# Patient Record
Sex: Male | Born: 1945 | Race: White | Hispanic: No | Marital: Married | Smoking: Former smoker
Health system: Southern US, Community
[De-identification: ages and names within clinical notes are randomized; demographics above are authoritative.]

## PROBLEM LIST (undated history)

## (undated) DIAGNOSIS — M1990 Unspecified osteoarthritis, unspecified site: Secondary | ICD-10-CM

## (undated) DIAGNOSIS — K219 Gastro-esophageal reflux disease without esophagitis: Secondary | ICD-10-CM

## (undated) DIAGNOSIS — E785 Hyperlipidemia, unspecified: Secondary | ICD-10-CM

## (undated) DIAGNOSIS — G4733 Obstructive sleep apnea (adult) (pediatric): Principal | ICD-10-CM

## (undated) DIAGNOSIS — I10 Essential (primary) hypertension: Secondary | ICD-10-CM

## (undated) DIAGNOSIS — Z87442 Personal history of urinary calculi: Secondary | ICD-10-CM

## (undated) HISTORY — PX: LITHOTRIPSY: SUR834

## (undated) HISTORY — DX: Obstructive sleep apnea (adult) (pediatric): G47.33

## (undated) HISTORY — DX: Essential (primary) hypertension: I10

## (undated) HISTORY — DX: Hyperlipidemia, unspecified: E78.5

## (undated) HISTORY — PX: WRIST FRACTURE SURGERY: SHX121

## (undated) HISTORY — PX: COLONOSCOPY: SHX174

## (undated) HISTORY — PX: PATELLA FRACTURE SURGERY: SHX735

---

## 2000-02-01 ENCOUNTER — Other Ambulatory Visit: Admission: RE | Admit: 2000-02-01 | Discharge: 2000-02-01 | Payer: Self-pay | Admitting: *Deleted

## 2001-01-30 ENCOUNTER — Encounter: Admission: RE | Admit: 2001-01-30 | Discharge: 2001-01-30 | Payer: Self-pay | Admitting: Urology

## 2001-01-30 ENCOUNTER — Encounter: Payer: Self-pay | Admitting: Urology

## 2001-08-12 ENCOUNTER — Emergency Department (HOSPITAL_COMMUNITY): Admission: EM | Admit: 2001-08-12 | Discharge: 2001-08-12 | Payer: Self-pay | Admitting: *Deleted

## 2001-08-12 ENCOUNTER — Encounter: Payer: Self-pay | Admitting: *Deleted

## 2009-06-02 ENCOUNTER — Encounter: Admission: RE | Admit: 2009-06-02 | Discharge: 2009-06-02 | Payer: Self-pay | Admitting: Endocrinology

## 2010-07-13 ENCOUNTER — Encounter: Admission: RE | Admit: 2010-07-13 | Discharge: 2010-07-13 | Payer: Self-pay | Admitting: Endocrinology

## 2014-06-09 ENCOUNTER — Ambulatory Visit (INDEPENDENT_AMBULATORY_CARE_PROVIDER_SITE_OTHER): Payer: Medicare Other | Admitting: Pulmonary Disease

## 2014-06-09 ENCOUNTER — Encounter (INDEPENDENT_AMBULATORY_CARE_PROVIDER_SITE_OTHER): Payer: Self-pay

## 2014-06-09 ENCOUNTER — Encounter: Payer: Self-pay | Admitting: Pulmonary Disease

## 2014-06-09 VITALS — BP 100/62 | HR 94 | Ht 67.75 in | Wt 191.8 lb

## 2014-06-09 DIAGNOSIS — G4733 Obstructive sleep apnea (adult) (pediatric): Secondary | ICD-10-CM

## 2014-06-09 DIAGNOSIS — R0609 Other forms of dyspnea: Secondary | ICD-10-CM

## 2014-06-09 DIAGNOSIS — R0683 Snoring: Secondary | ICD-10-CM

## 2014-06-09 DIAGNOSIS — R0989 Other specified symptoms and signs involving the circulatory and respiratory systems: Secondary | ICD-10-CM

## 2014-06-09 HISTORY — DX: Obstructive sleep apnea (adult) (pediatric): G47.33

## 2014-06-09 NOTE — Progress Notes (Deleted)
   Subjective:    Patient ID: Bradley Rose, male    DOB: 06-15-1946, 68 y.o.   MRN: [id number]  HPI    Review of Systems  Constitutional: Negative for fever and unexpected weight change.  HENT: Negative for congestion, dental problem, ear pain, nosebleeds, postnasal drip, rhinorrhea, sinus pressure, sneezing, sore throat and trouble swallowing.   Eyes: Negative for redness and itching.  Respiratory: Negative for cough, chest tightness, shortness of breath and wheezing.   Cardiovascular: Negative for palpitations and leg swelling.  Gastrointestinal: Negative for nausea and vomiting.       Indigestion  Genitourinary: Negative for dysuria.  Musculoskeletal: Positive for arthralgias. Negative for joint swelling.  Skin: Negative for rash.  Neurological: Negative for headaches.  Hematological: Does not bruise/bleed easily.  Psychiatric/Behavioral: Negative for dysphoric mood. The patient is not nervous/anxious.        Objective:   Physical Exam        Assessment & Plan:

## 2014-06-09 NOTE — Assessment & Plan Note (Signed)
He has snoring, sleep disruption, witnessed apnea, and daytime sleepiness.  He has history of hypertension and family history of sleep apnea.  I am concerned he could also have sleep apnea.  We discussed how sleep apnea can affect various health problems including risks for hypertension, cardiovascular disease, and diabetes.  We also discussed how sleep disruption can increase risks for accident, such as while driving.  Weight loss as a means of improving sleep apnea was also reviewed.  Additional treatment options discussed were CPAP therapy, oral appliance, and surgical intervention.  To further assess will arrange for home sleep study pending insurance approval.

## 2014-06-09 NOTE — Progress Notes (Signed)
Chief Complaint  Patient presents with  . SLEEP CONSULT    Referred by Dr Doristine Counter. Epworth Score: 4    History of Present Illness: Bradley Rose is a 68 y.o. male for evaluation of sleep problems.  His wife has been concerned about his snoring.  She has also told him that he stops breathing while asleep.  He was advised several years ago about needing a sleep study, but didn't follow through.  His brother was found to have heart problems and sleep apnea.  As a result he was more concerned about his sleep issues.  He goes to sleep at 10 pm.  He falls asleep quickly.  He wakes up 1 or 2 times to use the bathroom.  He gets out of bed at 6 am.  He feels okay in the morning.  He denies morning headache.  He does not use anything to help him fall sleep.  He drinks coffee in the morning.  He has trouble sleeping on his back.  He will have to shift positions while asleep due to shoulder pain.  He will sometimes take a hydrocodone to help with the pain, but not often.  He denies sleep walking, sleep talking, bruxism, or nightmares.  There is no history of restless legs.  He denies sleep hallucinations, sleep paralysis, or cataplexy.  The Epworth score is 4 out of 24.  Bradley Rose  has a past medical history of HTN (hypertension) and Hyperlipidemia.  Bradley Rose  has past surgical history that includes Lithotripsy.     Medication List       This list is accurate as of: 06/09/14  2:37 PM.  Always use your most recent med list.               allopurinol 300 MG tablet  Commonly known as:  ZYLOPRIM  Take 300 mg by mouth daily.     aspirin 81 MG tablet  Take 81 mg by mouth daily.     atorvastatin 80 MG tablet  Commonly known as:  LIPITOR  Take 80 mg by mouth daily.     celecoxib 200 MG capsule  Commonly known as:  CELEBREX  Take 200 mg by mouth daily.     ezetimibe 10 MG tablet  Commonly known as:  ZETIA  Take 10 mg by mouth daily.     Fish Oil 1000 MG Caps  Take 1  capsule by mouth daily.     HYDROcodone-acetaminophen 5-325 MG per tablet  Commonly known as:  NORCO/VICODIN  Take 1 tablet by mouth every 6 (six) hours as needed for moderate pain.     omeprazole 20 MG capsule  Commonly known as:  PRILOSEC  Take 20 mg by mouth daily.     valsartan-hydrochlorothiazide 160-25 MG per tablet  Commonly known as:  DIOVAN-HCT  Take 1 tablet by mouth daily.     Vitamin D3 50000 UNITS Caps  Take 1 capsule by mouth every 21 ( twenty-one) days.     ZANTAC PO  Take by mouth as needed.        No Known Allergies  His family history includes Hypertension in his father; Macular degeneration in his mother; Sleep apnea in his brother.  He  reports that he quit smoking about 20 years ago. His smoking use included Cigarettes. He has a 12.5 pack-year smoking history. He does not have any smokeless tobacco history on file. He reports that he drinks alcohol. He reports that he does not  use illicit drugs.  Review of Systems  Constitutional: Negative for fever and unexpected weight change.  HENT: Negative for congestion, dental problem, ear pain, nosebleeds, postnasal drip, rhinorrhea, sinus pressure, sneezing, sore throat and trouble swallowing.   Eyes: Negative for redness and itching.  Respiratory: Negative for cough, chest tightness, shortness of breath and wheezing.   Cardiovascular: Negative for palpitations and leg swelling.  Gastrointestinal: Negative for nausea and vomiting.       Indigestion  Genitourinary: Negative for dysuria.  Musculoskeletal: Positive for arthralgias. Negative for joint swelling.  Skin: Negative for rash.  Neurological: Negative for headaches.  Hematological: Does not bruise/bleed easily.  Psychiatric/Behavioral: Negative for dysphoric mood. The patient is not nervous/anxious.    Physical Exam:  General - No distress ENT - No sinus tenderness, no oral exudate, no LAN, no thyromegaly, TM clear, pupils equal/reactive, MP 2, high  arched palate, nasal septal deviation Cardiac - s1s2 regular, no murmur, pulses symmetric Chest - No wheeze/rales/dullness, good air entry, normal respiratory excursion Back - No focal tenderness Abd - Soft, non-tender, no organomegaly, + bowel sounds Ext - No edema Neuro - Normal strength, cranial nerves intact Skin - No rashes Psych - Normal mood, and behavior  Assessment/plan:  Coralyn Helling, M.D. Pager [phone]

## 2014-06-09 NOTE — Patient Instructions (Signed)
Will arrange for home sleep study Will call to arrange for follow up after sleep study reviewed  

## 2014-07-04 ENCOUNTER — Institutional Professional Consult (permissible substitution): Payer: Self-pay | Admitting: Pulmonary Disease

## 2014-08-02 ENCOUNTER — Telehealth: Payer: Self-pay | Admitting: Pulmonary Disease

## 2014-08-02 NOTE — Telephone Encounter (Signed)
Called spoke with patient who reported [facility] and was able to schedule another time to pick up an [place]. Per pt, nothing further is needed Will sign off

## 2014-08-02 NOTE — Telephone Encounter (Signed)
Called & spoke with patient.  I have [facility]cheduled him to [facility]

## 2014-08-08 DIAGNOSIS — G4730 Sleep apnea, unspecified: Secondary | ICD-10-CM

## 2014-08-10 ENCOUNTER — Telehealth: Payer: Self-pay | Admitting: Pulmonary Disease

## 2014-08-10 ENCOUNTER — Encounter: Payer: Self-pay | Admitting: Pulmonary Disease

## 2014-08-10 DIAGNOSIS — G4733 Obstructive sleep apnea (adult) (pediatric): Secondary | ICD-10-CM

## 2014-08-10 NOTE — Telephone Encounter (Signed)
HST 08/08/14 >> AHI 49.9, SaO2 low 81%.  Will have my nurse inform pt that sleep study shows severe sleep apnea.  Options at this time are 1) arrange for CPAP set up now with ROV in 2 months, 2) ROV first.  If pt is okay with CPAP set up, then please send order for auto CPAP range 5 to 15 cm H2O with heated humidity and mask of choice.  Have download sent 1 month after starting CPAP and ROV 2 months after starting CPAP.

## 2014-08-11 DIAGNOSIS — G4730 Sleep apnea, unspecified: Secondary | ICD-10-CM

## 2014-08-11 NOTE — Telephone Encounter (Signed)
Order placed for CPAP start.  2 month recall entered Nothing further needed.

## 2014-08-12 ENCOUNTER — Encounter: Payer: Self-pay | Admitting: Pulmonary Disease

## 2014-11-28 ENCOUNTER — Ambulatory Visit (INDEPENDENT_AMBULATORY_CARE_PROVIDER_SITE_OTHER): Payer: Medicare Other | Admitting: Pulmonary Disease

## 2014-11-28 ENCOUNTER — Encounter: Payer: Self-pay | Admitting: Pulmonary Disease

## 2014-11-28 ENCOUNTER — Encounter (INDEPENDENT_AMBULATORY_CARE_PROVIDER_SITE_OTHER): Payer: Self-pay

## 2014-11-28 VITALS — BP 102/80 | HR 85 | Temp 98.0°F | Ht 67.5 in | Wt 198.8 lb

## 2014-11-28 DIAGNOSIS — G4733 Obstructive sleep apnea (adult) (pediatric): Secondary | ICD-10-CM

## 2014-11-28 NOTE — Patient Instructions (Signed)
Follow up in 1 year.

## 2014-11-28 NOTE — Progress Notes (Signed)
Chief Complaint  Patient presents with  . Follow-up    cpap pt wears nightly usually 8 hrs except on early work days.    History of Present Illness: Bradley Rose is a 69 y.o. male with OSA.  Since his last visit he had home sleep study.  This showed severe OSA.  He was then started on CPAP.  He has full face mask.  He has been sleeping better and feeling better.  He no longer snores.  TESTS: HST 08/08/14 >> AHI 49.9, SaO2 low 81%. Auto CPAP 10/29/14 to 11/27/14 >> used on 28 of 30 nights with average 7 hrs and 47 min.  Average AHI is 6.7 with median CPAP 10 cm H2O and 95 th percentile CPAP 14 cm H20.  Past medical hx >> HTN, HLD  Past surgical hx, Medications, Allergies, Family hx, Social hx all reviewed.  Physical Exam: Blood pressure 102/80, pulse 85, temperature 98 F (36.7 C), temperature source Oral, height 5' 7.5" (1.715 m), weight 198 lb 12.8 oz (90.175 kg), SpO2 97 %. Body mass index is 30.66 kg/(m^2).  General - No distress ENT - No sinus tenderness, no oral exudate, no LAN, MP 2, high arched palate, nasal septal deviation Cardiac - s1s2 regular, no murmur Chest - No wheeze/rales/dullness Back - No focal tenderness Abd - Soft, non-tender Ext - No edema Neuro - Normal strength Skin - No rashes Psych - normal mood, and behavior   Assessment/Plan:  Obstructive sleep apnea. I have reviewed the recent sleep study results with the patient.  We discussed how sleep apnea can affect various health problems including risks for hypertension, cardiovascular disease, and diabetes.  We also discussed how sleep disruption can increase risks for accident, such as while driving.  Weight loss as a means of improving sleep apnea was also reviewed.  Additional treatment options discussed were CPAP therapy, oral appliance, and surgical intervention. Plan: - will continue auto CPAP  Obesity. Plan: - discussed options to approach weight loss   Bradley HellingVineet Hildegard Hlavac, MD [place]  Pulmonary/Critical Care/Sleep Pager:  [phone]

## 2015-01-04 ENCOUNTER — Emergency Department (HOSPITAL_COMMUNITY): Payer: Medicare Other

## 2015-01-04 ENCOUNTER — Encounter (HOSPITAL_COMMUNITY): Payer: Self-pay | Admitting: *Deleted

## 2015-01-04 ENCOUNTER — Emergency Department (HOSPITAL_COMMUNITY)
Admission: EM | Admit: 2015-01-04 | Discharge: 2015-01-04 | Disposition: A | Discharge status: Home / Self Care | Payer: Medicare Other | Attending: Emergency Medicine | Admitting: Emergency Medicine

## 2015-01-04 DIAGNOSIS — Z791 Long term (current) use of non-steroidal anti-inflammatories (NSAID): Secondary | ICD-10-CM | POA: Insufficient documentation

## 2015-01-04 DIAGNOSIS — S0001XA Abrasion of scalp, initial encounter: Secondary | ICD-10-CM | POA: Diagnosis not present

## 2015-01-04 DIAGNOSIS — W11XXXA Fall on and from ladder, initial encounter: Secondary | ICD-10-CM | POA: Insufficient documentation

## 2015-01-04 DIAGNOSIS — E785 Hyperlipidemia, unspecified: Secondary | ICD-10-CM | POA: Diagnosis not present

## 2015-01-04 DIAGNOSIS — Z87891 Personal history of nicotine dependence: Secondary | ICD-10-CM | POA: Diagnosis not present

## 2015-01-04 DIAGNOSIS — S50311A Abrasion of right elbow, initial encounter: Secondary | ICD-10-CM | POA: Insufficient documentation

## 2015-01-04 DIAGNOSIS — Z792 Long term (current) use of antibiotics: Secondary | ICD-10-CM | POA: Diagnosis not present

## 2015-01-04 DIAGNOSIS — Z7982 Long term (current) use of aspirin: Secondary | ICD-10-CM | POA: Diagnosis not present

## 2015-01-04 DIAGNOSIS — S80211A Abrasion, right knee, initial encounter: Secondary | ICD-10-CM | POA: Diagnosis not present

## 2015-01-04 DIAGNOSIS — Y9289 Other specified places as the place of occurrence of the external cause: Secondary | ICD-10-CM | POA: Insufficient documentation

## 2015-01-04 DIAGNOSIS — I10 Essential (primary) hypertension: Secondary | ICD-10-CM | POA: Insufficient documentation

## 2015-01-04 DIAGNOSIS — S0990XA Unspecified injury of head, initial encounter: Secondary | ICD-10-CM | POA: Insufficient documentation

## 2015-01-04 DIAGNOSIS — Y9389 Activity, other specified: Secondary | ICD-10-CM | POA: Diagnosis not present

## 2015-01-04 DIAGNOSIS — Z8669 Personal history of other diseases of the nervous system and sense organs: Secondary | ICD-10-CM | POA: Diagnosis not present

## 2015-01-04 DIAGNOSIS — Y998 Other external cause status: Secondary | ICD-10-CM | POA: Diagnosis not present

## 2015-01-04 MED ORDER — BACITRACIN 500 UNIT/GM EX OINT: 1.0000 "application " | TOPICAL_OINTMENT | Freq: Two times a day (BID) | CUTANEOUS | Status: DC

## 2015-01-04 NOTE — ED Notes (Signed)
Pt arrives from home via [facility]. Pt was on a ladder at a height around 8-10 ft and slipped and fell rt a broken step on ladder. Pt fell backwards onto the deck and was unconscious for "a few minutes" according. Pt currently has no complaints of pain or any complaints. Pt was disoriented upon EMS arrival, but is a&o upon arrival to ED.

## 2015-01-04 NOTE — Discharge Instructions (Signed)
Concussion Take Tylenol for mild pain or your hydrocodone for bad pain. Wash wounds daily with soap and water and place a thin layer of bacitracin ointment over the abrasions. See your primary care physician if pain not so improving by next week. Return if your condition worsens for any reason A concussion is a brain injury. It is caused by:  A hit to the head.  A quick and sudden movement (jolt) of the head or neck. A concussion is usually not life threatening. Even so, it can cause serious problems. If you had a concussion before, you may have concussion-like problems after a hit to your head. HOME CARE General Instructions  Follow your doctor's directions carefully.  Take medicines only as told by your doctor.  Only take medicines your doctor says are safe.  Do not drink alcohol until your doctor says it is okay. Alcohol and some drugs can slow down healing. They can also put you at risk for further injury.  If you are having trouble remembering things, write them down.  Try to do one thing at a time if you get distracted easily. For example, do not watch TV while making dinner.  Talk to your family members or close friends when making important decisions.  Follow up with your doctor as told.  Watch your symptoms. Tell others to do the same. Serious problems can sometimes happen after a concussion. Older adults are more likely to have these problems.  Tell your teachers, school nurse, school counselor, coach, Event organiser, or work Production designer, theatre/television/film about your concussion. Tell them about what you can or cannot do. They should watch to see if:  It gets even harder for you to pay attention or concentrate.  It gets even harder for you to remember things or learn new things.  You need more time than normal to finish things.  You become annoyed (irritable) more than before.  You are not able to deal with stress as well.  You have more problems than before.  Rest. Make sure  you:  Get plenty of sleep at night.  Go to sleep early.  Go to bed at the same time every day. Try to wake up at the same time.  Rest during the day.  Take naps when you feel tired.  Limit activities where you have to think a lot or concentrate. These include:  Doing homework.  Doing work related to a job.  Watching TV.  Using the computer. Returning To Your Regular Activities Return to your normal activities slowly, not all at once. You must give your body and brain enough time to heal.   Do not play sports or do other athletic activities until your doctor says it is okay.  Ask your doctor when you can drive, ride a bicycle, or work other vehicles or machines. Never do these things if you feel dizzy.  Ask your doctor about when you can return to work or school. Preventing Another Concussion It is very important to avoid another brain injury, especially before you have healed. In rare cases, another injury can lead to permanent brain damage, brain swelling, or death. The risk of this is greatest during the first 7-10 days after your injury. Avoid injuries by:   Wearing a seat belt when riding in a car.  Not drinking too much alcohol.  Avoiding activities that could lead to a second concussion (such as contact sports).  Wearing a helmet when doing activities like:  Biking.  Skiing.  Skateboarding.  Skating.  Making your home safer by:  Removing things from the floor or stairways that could make you trip.  Using grab bars in bathrooms and handrails by stairs.  Placing non-slip mats on floors and in bathtubs.  Improve lighting in dark areas. GET HELP IF:  It gets even harder for you to pay attention or concentrate.  It gets even harder for you to remember things or learn new things.  You need more time than normal to finish things.  You become annoyed (irritable) more than before.  You are not able to deal with stress as well.  You have more problems  than before.  You have problems keeping your balance.  You are not able to react quickly when you should. Get help if you have any of these problems for more than 2 weeks:   Lasting (chronic) headaches.  Dizziness or trouble balancing.  Feeling sick to your stomach (nausea).  Seeing (vision) problems.  Being affected by noises or light more than normal.  Feeling sad, low, down in the dumps, blue, gloomy, or empty (depressed).  Mood changes (mood swings).  Feeling of fear or nervousness about what may happen (anxiety).  Feeling annoyed.  Memory problems.  Problems concentrating or paying attention.  Sleep problems.  Feeling tired all the time. GET HELP RIGHT AWAY IF:   You have bad headaches or your headaches get worse.  You have weakness (even if it is in one hand, leg, or part of the face).  You have loss of feeling (numbness).  You feel off balance.  You keep throwing up (vomiting).  You feel tired.  One black center of your eye (pupil) is larger than the other.  You twitch or shake violently (convulse).  Your speech is not clear (slurred).  You are more confused, easily angered (agitated), or annoyed than before.  You have more trouble resting than before.  You are unable to recognize people or places.  You have neck pain.  It is difficult to wake you up.  You have unusual behavior changes.  You pass out (lose consciousness). MAKE SURE YOU:   Understand these instructions.  Will watch your condition.  Will get help right away if you are not doing well or get worse. Document Released: 09/11/2009 Document Revised: 02/07/2014 Document Reviewed: 04/15/2013 [facility] Patient Information 2015 [employer] [place]. This information is not intended to replace advice given to you by your health care provider. Make sure you discuss any questions you have with your health care provider.

## 2015-01-04 NOTE — ED Provider Notes (Signed)
CSN: [id number]     Arrival date & time 01/04/15  1513 History   First MD Initiated Contact with Patient 01/04/15 1533     Chief Complaint  Patient presents with  . Fall     (Consider location/radiation/quality/duration/timing/severity/associated sxs/prior Treatment) Patient is a 69 y.o. male presenting with fall.  Fall   Patient fell as ladders slipped out from under him immediately prior to coming here. He reportedly suffered loss of consciousness as result of event or process may 90 seconds. He denies pain anywhere. Brought via EMS . Treated with  long board with hard collar and CID. No other associated symptoms. Patient felt well prior to the event today. Denies alcohol use today Past Medical History  Diagnosis Date  . HTN (hypertension)   . Hyperlipidemia   . OSA (obstructive sleep apnea) 06/09/2014   Past Surgical History  Procedure Laterality Date  . Lithotripsy     Family History  Problem Relation Age of Onset  . Hypertension Father   . Macular degeneration Mother   . Sleep apnea Brother    History  Substance Use Topics  . Smoking status: Former Smoker -- 0.50 packs/day for 25 years    Types: Cigarettes    Quit date: 10/07/1993  . Smokeless tobacco: Not on file  . Alcohol Use: Yes     Comment: occassional    Review of Systems  Constitutional: Negative.   HENT: Negative.   Respiratory: Negative.   Cardiovascular: Negative.   Gastrointestinal: Negative.   Musculoskeletal: Negative.   Skin: Negative.   Neurological: Negative.   Psychiatric/Behavioral: Negative.   All other systems reviewed and are negative.     Allergies  Review of patient's allergies indicates no known allergies.  Home Medications   Prior to Admission medications   Medication Sig Start Date End Date Taking? Authorizing Provider  allopurinol (ZYLOPRIM) 300 MG tablet Take 300 mg by mouth daily.    Historical Provider, MD  aspirin 81 MG tablet Take 81 mg by mouth daily.    Historical  Provider, MD  atorvastatin (LIPITOR) 80 MG tablet Take 80 mg by mouth daily.    Historical Provider, MD  celecoxib (CELEBREX) 200 MG capsule Take 200 mg by mouth daily.    Historical Provider, MD  Cholecalciferol (VITAMIN D3) 50000 UNITS CAPS Take 1 capsule by mouth every 21 ( twenty-one) days.    Historical Provider, MD  HYDROcodone-acetaminophen (NORCO/VICODIN) 5-325 MG per tablet Take 1 tablet by mouth every 6 (six) hours as needed for moderate pain.    Historical Provider, MD  Omega-3 Fatty Acids (FISH OIL) 1000 MG CAPS Take 1 capsule by mouth daily.    Historical Provider, MD  omeprazole (PRILOSEC) 20 MG capsule Take 20 mg by mouth daily.    Historical Provider, MD  Ranitidine HCl (ZANTAC PO) Take by mouth as needed.    Historical Provider, MD  sulfamethoxazole-trimethoprim (BACTRIM DS,SEPTRA DS) 800-160 MG per tablet Take 1 tablet by mouth 2 (two) times daily. 11/22/14   Historical Provider, MD  valsartan-hydrochlorothiazide (DIOVAN-HCT) 160-25 MG per tablet Take 1 tablet by mouth daily.    Historical Provider, MD   BP 146/80 mmHg  Pulse 88  Temp(Src) 97.6 F (36.4 C) (Oral)  Resp 21  Ht  (1.727 m)  Wt 194 lb (87.998 kg)  BMI 29.50 kg/m2  SpO2 99% Physical Exam  Constitutional: He appears well-developed and well-nourished. No distress.  HENT:  Head: Atraumatic.  Right Ear: External ear normal.  Left Ear: External ear  normal.  Mouth/Throat: Oropharynx is clear and moist.  3 cm abrasion to vertex of scalp with surrounding hematoma. No tenderness. Otherwise no cephalic atraumatic. Bilateral tympanic membranes normal  Eyes: Conjunctivae are normal. Pupils are equal, round, and reactive to light.  Neck: Neck supple. No tracheal deviation present. No thyromegaly present.  No tenderness  Cardiovascular: Normal rate and regular rhythm.   No murmur heard. Pulmonary/Chest: Effort normal and breath sounds normal. He exhibits no tenderness.  Abdominal: Soft. Bowel sounds are normal.  He exhibits no distension. There is no tenderness.  No contusion abrasion or tenderness  Musculoskeletal: Normal range of motion. He exhibits no edema or tenderness.  Pelvis stable nontender. Entire spine nontender.  Neurological: He is alert. Coordination normal.  Glasgow Coma Score 15 HEENT normal Romberg normal pronator drift normal cranial nerves II through XII grossly intact. Not lightheaded on standing  Skin: Skin is warm and dry. No rash noted.  3 cm abrasion overlying right elbow posterior aspect. 3 cm abrasion overlying right knee, anterior aspect  Psychiatric: He has a normal mood and affect. His behavior is normal. Thought content normal.  Nursing note and vitals reviewed.   ED Course  Procedures (including critical care time) Labs Review Labs Reviewed - No data to display  Imaging Review No results found.   EKG Interpretation None     Patient declines pain medicine  6:40 PM patient is alert and motor Glasgow Coma Score 15 continues to decline pain medicine. Not lightheaded on standing. No results found for this or any previous visit. Ct Head Wo Contrast  01/04/2015   CLINICAL DATA:  Initial encounter for 8-10 foot fall from ladder onto deck. Positive for loss of consciousness.  EXAM: CT HEAD WITHOUT CONTRAST  TECHNIQUE: Contiguous axial images were obtained from the base of the skull through the vertex without intravenous contrast.  COMPARISON:  None.  FINDINGS: There is no evidence for acute hemorrhage, hydrocephalus, mass lesion, or abnormal extra-axial fluid collection. No definite CT evidence for acute infarction. There is some minimal chronic mucosal disease in the right maxillary sinus. Mastoid air cells are clear. Temporomandibular joints are located. No evidence for skull fracture. Scalp hematoma is seen in the right parietal region.  IMPRESSION: No acute intracranial abnormality.   Electronically Signed   By: Kennith CenterEric  Mansell M.D.   On: 01/04/2015 18:05    MDM   Cervical spine cleared via nexus criteria Final diagnoses:  None   Plan discharged to home. Topical antibiotics to abrasions. Tylenol or hydrocodone for pain. Diagnosis #1 fall #2 minor closed head injury with concussion #3 abrasions multiple sites     Doug SouSam Avryl Roehm, MD 01/04/15 1842

## 2015-01-04 NOTE — ED Notes (Signed)
Pt is in stable condition upon d/c and ambulates from ED. 

## 2015-01-04 NOTE — ED Notes (Signed)
Abrasions cleaned and bacitracin applied along with 2x2 gauze.

## 2015-11-29 ENCOUNTER — Encounter: Payer: Self-pay | Admitting: Pulmonary Disease

## 2015-11-29 ENCOUNTER — Ambulatory Visit (INDEPENDENT_AMBULATORY_CARE_PROVIDER_SITE_OTHER): Payer: Medicare Other | Admitting: Pulmonary Disease

## 2015-11-29 VITALS — BP 116/64 | HR 69 | Ht 67.75 in | Wt 193.0 lb

## 2015-11-29 DIAGNOSIS — Z9989 Dependence on other enabling machines and devices: Principal | ICD-10-CM

## 2015-11-29 DIAGNOSIS — G4733 Obstructive sleep apnea (adult) (pediatric): Secondary | ICD-10-CM | POA: Diagnosis not present

## 2015-11-29 NOTE — Progress Notes (Signed)
Current Outpatient Prescriptions on File Prior to Visit  Medication Sig  . allopurinol (ZYLOPRIM) 300 MG tablet Take 300 mg by mouth daily.  Marland Kitchen aspirin EC 81 MG tablet Take 81 mg by mouth daily.  Marland Kitchen atorvastatin (LIPITOR) 80 MG tablet Take 80 mg by mouth daily.  . celecoxib (CELEBREX) 200 MG capsule Take 200 mg by mouth daily.  Marland Kitchen HYDROcodone-acetaminophen (NORCO/VICODIN) 5-325 MG per tablet Take 0.5 tablets by mouth at bedtime as needed for moderate pain.   . naproxen sodium (ANAPROX) 220 MG tablet Take 440 mg by mouth daily as needed (pain). Aleve  . Omega-3 Fatty Acids (FISH OIL) 1000 MG CAPS Take 1,000 mg by mouth daily.   . Ranitidine HCl (ZANTAC PO) Take 1 tablet by mouth daily as needed (indigestion).   . valsartan-hydrochlorothiazide (DIOVAN-HCT) 160-25 MG per tablet Take 1 tablet by mouth daily.  . Vitamin D, Ergocalciferol, (DRISDOL) 50000 UNITS CAPS capsule Take 50,000 Units by mouth every 21 ( twenty-one) days. Last dose approx 12/27/14   No current facility-administered medications on file prior to visit.     Chief Complaint  Patient presents with  . Follow-up    Wears CPAP nightly. Denies any issues with mask/pressure. DME [facility]     Tests HST 08/08/14 >> AHI 49.9, SaO2 low 81%. Auto CPAP 10/29/15 to 11/27/15 >> used on 30 of 30 nights with average 7 hrs and 59 min.  Average AHI is 5.1 with median CPAP 10 cm H2O and 95 th percentile CPAP 14 cm H20.   Past medical hx HTN, HLD  Past surgical hx, Allergies, Family hx, Social hx all reviewed.  Vital Signs BP 116/64 mmHg  Pulse 69  Ht 5' 7.75" (1.721 m)  Wt 193 lb (87.544 kg)  BMI 29.56 kg/m2  SpO2 94%  History of Present Illness Bradley Rose is a 70 y.o. male with OSA.  He is doing well with CPAP.  He changed to full face mask.  He feels rested during the day.  He denies mouth dryness.  Physical Exam  General - No distress ENT - No sinus tenderness, no oral exudate, no LAN, MP 2, high arched palate Cardiac  - s1s2 regular, no murmur Chest - No wheeze/rales/dullness Back - No focal tenderness Abd - Soft, non-tender Ext - No edema Neuro - Normal strength Skin - No rashes Psych - normal mood, and behavior   Assessment/Plan  Obstructive sleep apnea. He is compliant with therapy and reports benefit from CPAP. Plan: - continue auto CPAP   Patient Instructions  Follow up in 1 year     Coralyn Helling, MD [facility]/Sleep Pager:  [phone]

## 2015-11-29 NOTE — Patient Instructions (Signed)
Follow up in 1 year.

## 2016-08-27 IMAGING — CT CT HEAD W/O CM
1 series · 16 of 30 positions shown, 20 images · non-contrast
Comparison: None.

CLINICAL DATA: Initial encounter for 8-10 foot fall from ladder
onto deck. Positive for loss of consciousness.

EXAM:
CT HEAD WITHOUT CONTRAST
TECHNIQUE: Contiguous axial images were obtained from the base of the skull
through the vertex without intravenous contrast.

[Series 2: head 5.0 h30s · axial · 0.46mm/px · z∈[+1507,+1652]mm · 16 of 33 slices shown, 20 images]
[im 2/33  brain]
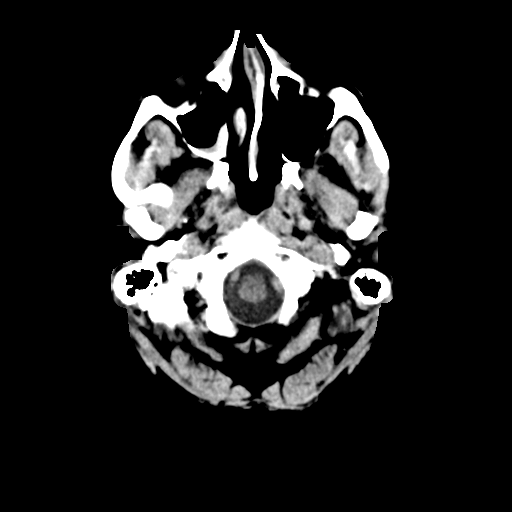
[im 2/33  bone]
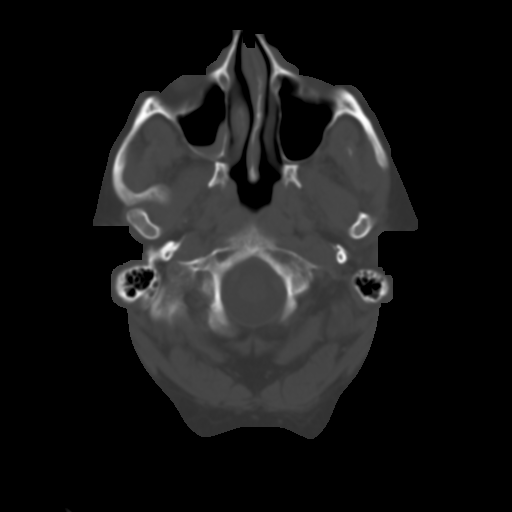
[im 4/33  brain]
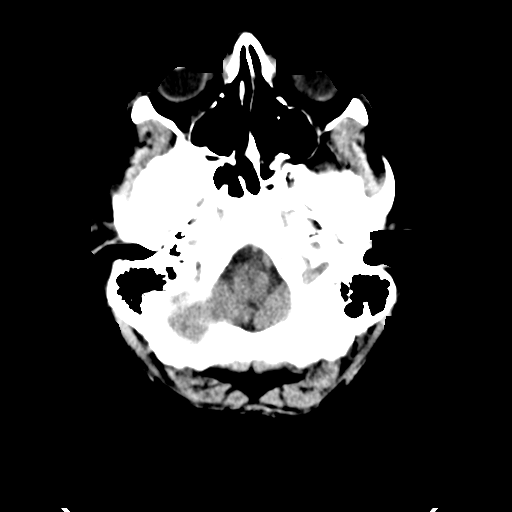
[im 6/33  brain]
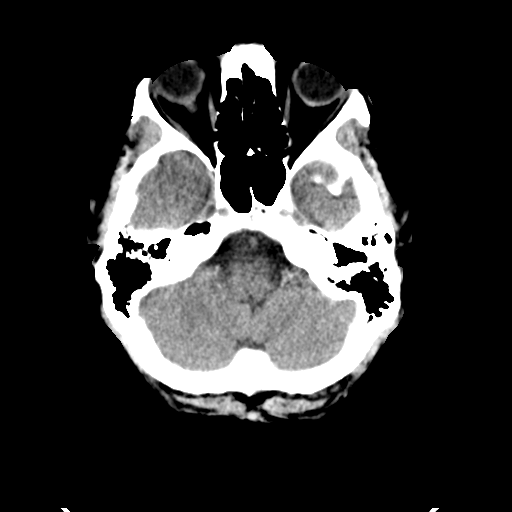
[im 8/33  brain]
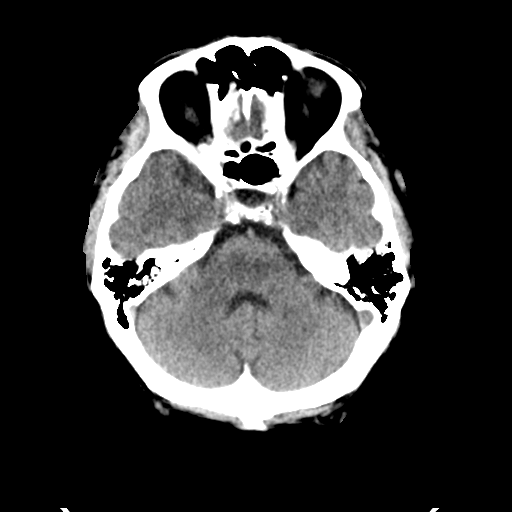
[im 9/33  brain]
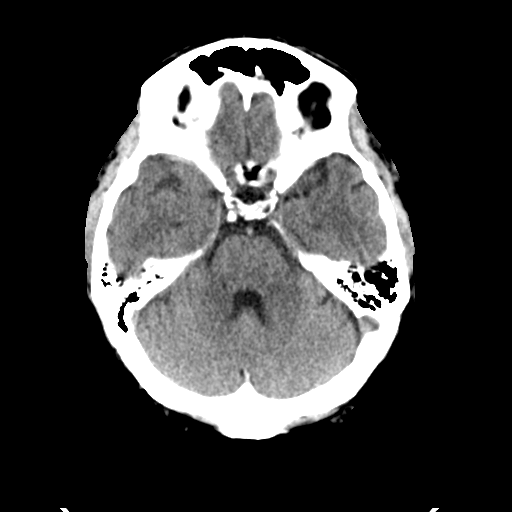
[im 9/33  bone]
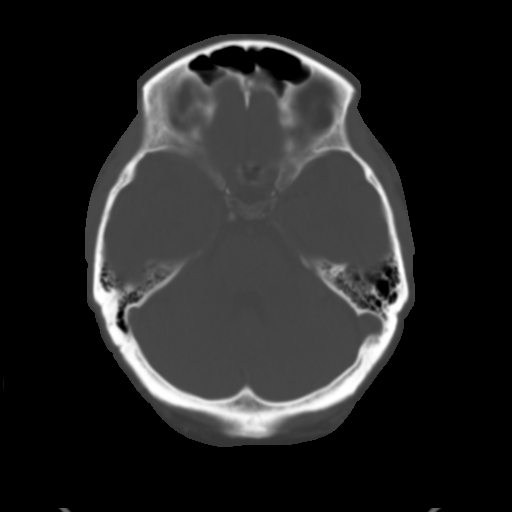
[im 12/33  brain]
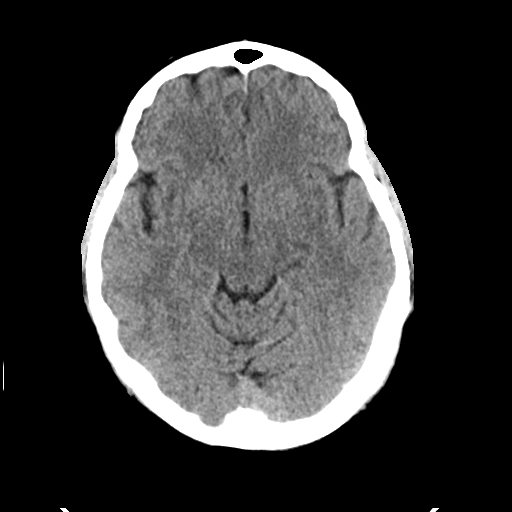
[im 14/33  brain]
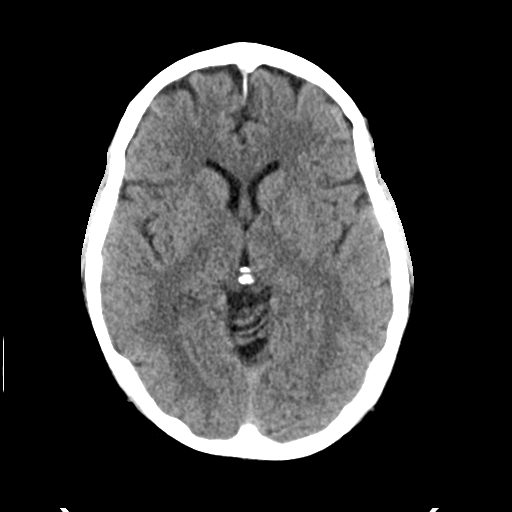
[im 16/33  brain]
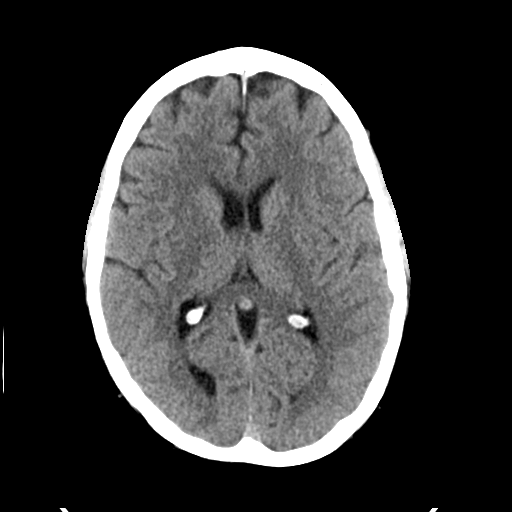
[im 17/33  brain]
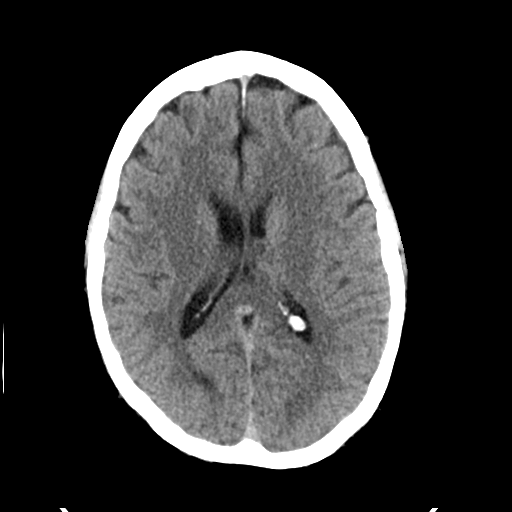
[im 17/33  bone]
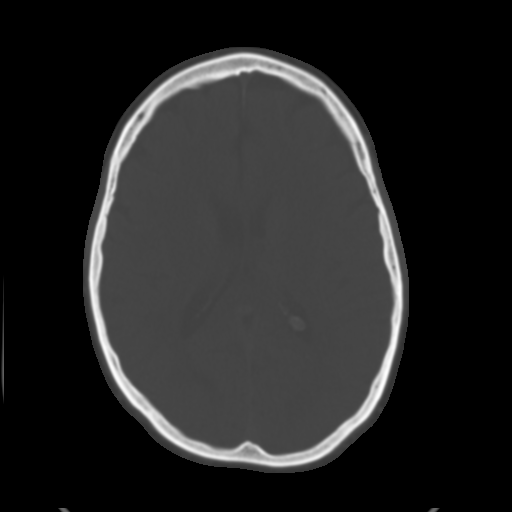
[im 19/33  brain]
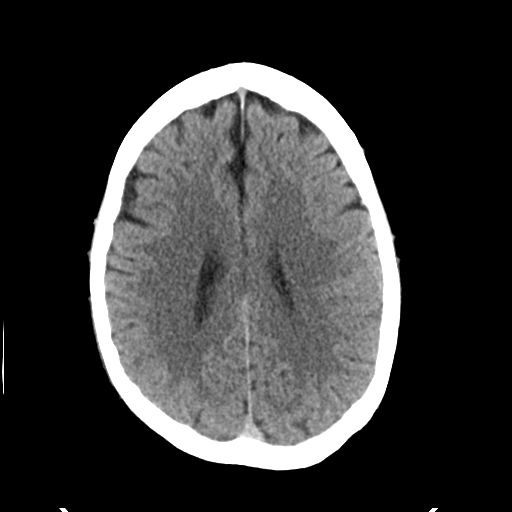
[im 21/33  brain]
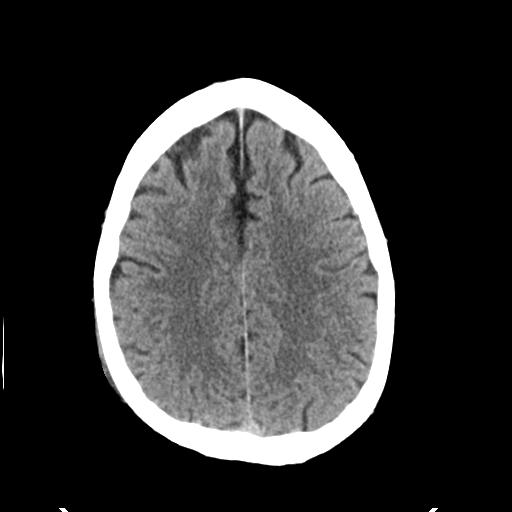
[im 24/33  brain]
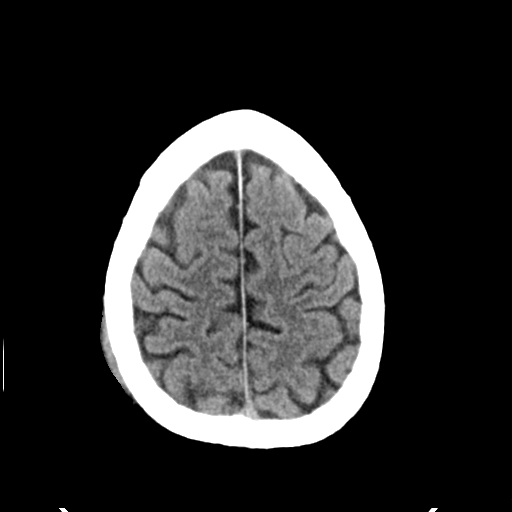
[im 25/33  brain]
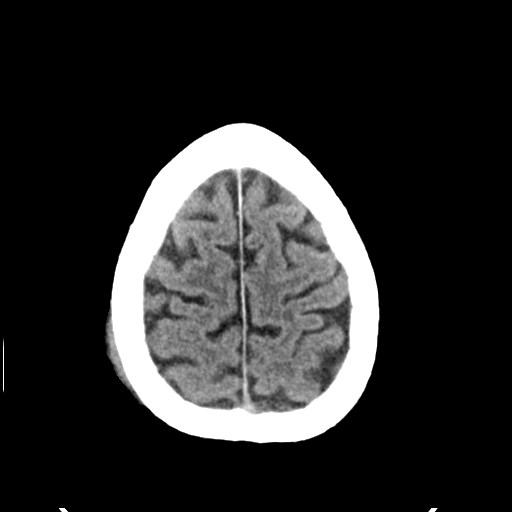
[im 25/33  bone]
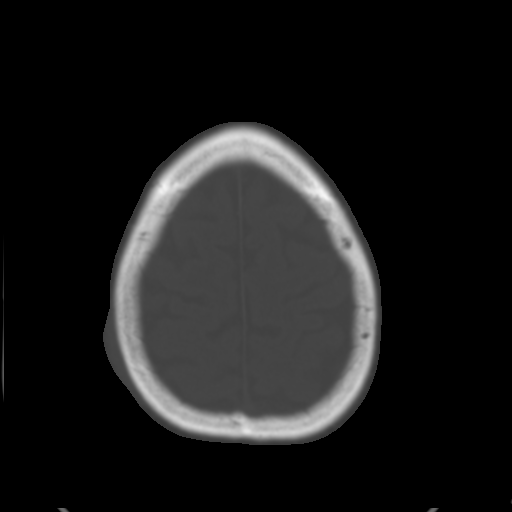
[im 27/33  brain]
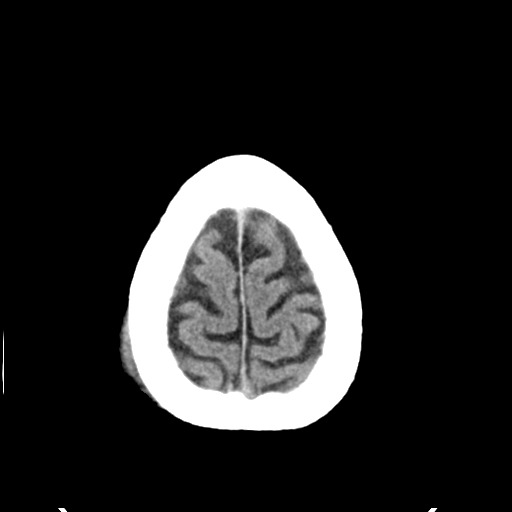
[im 29/33  brain]
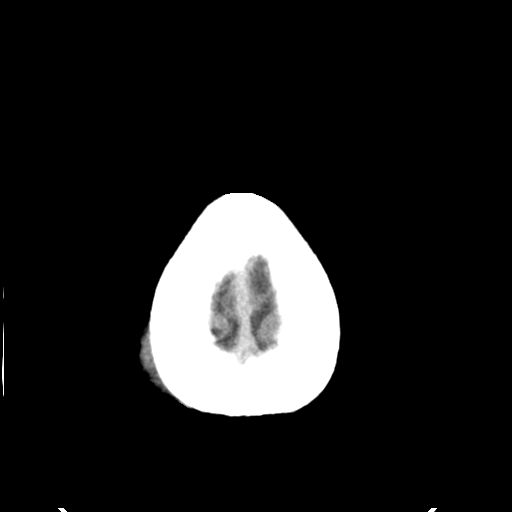
[im 31/33  brain]
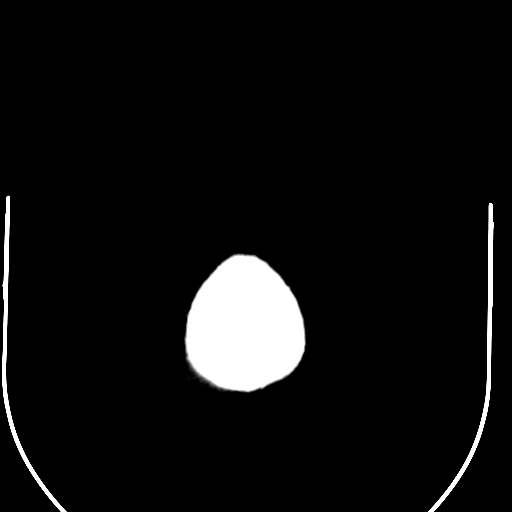

[16 of 30 positions shown; findings below may reference images not displayed]

FINDINGS: There is no evidence for acute hemorrhage, hydrocephalus, mass
lesion, or abnormal extra-axial fluid collection. No definite CT
evidence for acute infarction. There is some minimal chronic mucosal
disease in the right maxillary sinus. Mastoid air cells are clear.
Temporomandibular joints are located. No evidence for skull
fracture. Scalp hematoma is seen in the right parietal region.
IMPRESSION: No acute intracranial abnormality.

## 2016-11-28 ENCOUNTER — Encounter: Payer: Self-pay | Admitting: Pulmonary Disease

## 2016-11-28 ENCOUNTER — Ambulatory Visit (INDEPENDENT_AMBULATORY_CARE_PROVIDER_SITE_OTHER): Payer: Medicare Other | Admitting: Pulmonary Disease

## 2016-11-28 VITALS — BP 104/70 | HR 66 | Ht 67.25 in | Wt 175.0 lb

## 2016-11-28 DIAGNOSIS — G4733 Obstructive sleep apnea (adult) (pediatric): Secondary | ICD-10-CM

## 2016-11-28 DIAGNOSIS — Z9989 Dependence on other enabling machines and devices: Secondary | ICD-10-CM | POA: Diagnosis not present

## 2016-11-28 NOTE — Patient Instructions (Signed)
Follow up in 1 year.

## 2016-11-28 NOTE — Progress Notes (Signed)
Current Outpatient Prescriptions on File Prior to Visit  Medication Sig  . allopurinol (ZYLOPRIM) 300 MG tablet Take 300 mg by mouth daily.  Marland Kitchen. aspirin EC 81 MG tablet Take 81 mg by mouth daily.  Marland Kitchen. atorvastatin (LIPITOR) 80 MG tablet Take 80 mg by mouth daily.  . celecoxib (CELEBREX) 200 MG capsule Take 200 mg by mouth daily.  Marland Kitchen. HYDROcodone-acetaminophen (NORCO/VICODIN) 5-325 MG per tablet Take 0.5 tablets by mouth at bedtime as needed for moderate pain.   . Omega-3 Fatty Acids (FISH OIL) 1000 MG CAPS Take 1,000 mg by mouth daily.   . Ranitidine HCl (ZANTAC PO) Take 1 tablet by mouth daily as needed (indigestion).   . valsartan-hydrochlorothiazide (DIOVAN-HCT) 160-25 MG per tablet Take 1 tablet by mouth daily.  . Vitamin D, Ergocalciferol, (DRISDOL) 50000 UNITS CAPS capsule Take 50,000 Units by mouth every 21 ( twenty-one) days. Last dose approx 12/27/14   No current facility-administered medications on file prior to visit.      Chief Complaint  Patient presents with  . Follow-up    Wears CPAP nightly. Denies problems with pressure setting. Pt states that he has tried different masks and cushions and has finally found something that fits comfortably and does not leak. Discuss possibility of coming off CPAP. DME: [facility]     Sleep tests HST 08/08/14 >> AHI 49.9, SaO2 low 81%. Auto CPAP 07/25/16 to 08/23/16 >> used on 30 of 30 nights with average 8 hrs 15 min.  Average AHI 5.9 with median CPAP 11 cm H2O and 95 th percentile CPAP 14 cm H2O  Past medical history HTN, HLD, Calcium oxalate nephrolithiasist, GERD, Allergies  Past surgical history, Family history, Social history, Allergies reviewed  Vital Signs BP 104/70 (BP Location: Left Arm, Cuff Size: Normal)   Pulse 66   Ht 5' 7.25" (1.708 m)   Wt 175 lb (79.4 kg)   SpO2 98%   BMI 27.21 kg/m   History of Present Illness Fawn KirkWilliam J Laurel is a 71 y.o. male with OSA.  He has lost about 20 lbs since original set up.  He has been  exercising more, and changed his diet.  He is feeling better.  He uses CPAP nightly.  He had to change back to his original mask >> tried mask with memory foam, but left mark on his nose.  Physical Exam  General - pleasant Eyes - wears glasses ENT - no sinus tenderness, no oral exudate, MP 2, high arched palate, no LAN Cardiac - regular, no murmur Chest - no wheeze/rales Back - no tenderness Abd - soft, non tender Ext - no edema Neuro - normal strength Skin - no rashes Psych - normal mood   Assessment/Plan  Obstructive sleep apnea. - he is compliant with CPAP and reports benefit from therapy - continue auto CPAP - if he continues to lose weight, then might be able to consider reassessing status of sleep apnea   Patient Instructions  Follow up in 1 year    Coralyn HellingVineet Bryahna Lesko, MD [facility]/Sleep Pager:  [phone]

## 2017-07-18 ENCOUNTER — Emergency Department (HOSPITAL_COMMUNITY)
Admission: EM | Admit: 2017-07-18 | Discharge: 2017-07-18 | Disposition: A | Discharge status: Home / Self Care | Payer: Medicare Other | Attending: Emergency Medicine | Admitting: Emergency Medicine

## 2017-07-18 ENCOUNTER — Encounter (HOSPITAL_COMMUNITY): Payer: Self-pay | Admitting: Emergency Medicine

## 2017-07-18 DIAGNOSIS — Z79899 Other long term (current) drug therapy: Secondary | ICD-10-CM | POA: Insufficient documentation

## 2017-07-18 DIAGNOSIS — Z87891 Personal history of nicotine dependence: Secondary | ICD-10-CM | POA: Insufficient documentation

## 2017-07-18 DIAGNOSIS — S81012A Laceration without foreign body, left knee, initial encounter: Secondary | ICD-10-CM | POA: Diagnosis not present

## 2017-07-18 DIAGNOSIS — I10 Essential (primary) hypertension: Secondary | ICD-10-CM | POA: Insufficient documentation

## 2017-07-18 DIAGNOSIS — Y939 Activity, unspecified: Secondary | ICD-10-CM | POA: Insufficient documentation

## 2017-07-18 DIAGNOSIS — Y929 Unspecified place or not applicable: Secondary | ICD-10-CM | POA: Insufficient documentation

## 2017-07-18 DIAGNOSIS — Y999 Unspecified external cause status: Secondary | ICD-10-CM | POA: Diagnosis not present

## 2017-07-18 DIAGNOSIS — W312XXA Contact with powered woodworking and forming machines, initial encounter: Secondary | ICD-10-CM | POA: Diagnosis not present

## 2017-07-18 MED ORDER — LIDOCAINE-EPINEPHRINE (PF) 2 %-1:200000 IJ SOLN
10.0000 mL | Freq: Once | INTRAMUSCULAR | Status: AC
  Administered 2017-07-18: 10 mL
  Filled 2017-07-18: qty 20

## 2017-07-18 MED ORDER — CEPHALEXIN 250 MG PO CAPS: 250.0000 mg | ORAL_CAPSULE | Freq: Four times a day (QID) | ORAL | 0 refills | Status: DC

## 2017-07-18 MED ORDER — TETANUS-DIPHTH-ACELL PERTUSSIS 5-2.5-18.5 LF-MCG/0.5 IM SUSP
0.5000 mL | Freq: Once | INTRAMUSCULAR | Status: AC
  Administered 2017-07-18: 0.5 mL via INTRAMUSCULAR
  Filled 2017-07-18: qty 0.5

## 2017-07-18 NOTE — ED Triage Notes (Signed)
Patient c/o laceration to left knee after cutting it with the chain saw. Approximately three inch lac. Bleeding controlled. Ambulatory. Reports he irrigated it PTA.

## 2017-07-18 NOTE — Discharge Instructions (Signed)
Taking antibiotics as prescribed. Use Tylenol or ibuprofen as needed for pain. Keep the wound covered for the next 24 hours. After this, you may remove dressing, wash gently with soap and water, and reapply new dressing. Do not soak or submerge the cut until the sutures are removed. Follow-up with your primary care doctor in 10 days for removal of the sutures. Return to the emergency room or your primary care doctor if you develop fevers, chills, purulent drainage, redness spreading up or down her leg, or any new or worsening symptoms.

## 2017-07-18 NOTE — ED Provider Notes (Signed)
[facility]-EMERGENCY DEPT Provider Note   CSN: [id number] Arrival date & time: 07/18/17  1335     History   Chief Complaint Chief Complaint  Patient presents with  . Laceration    HPI Bradley Rose is a 71 y.o. male resenting with left knee laceration.   Patient states he was using a chain saw when it slipped and he accidentally cut his left knee. This happened today, proximately 4 hours prior to arrival. He states he cleaned it at home with soap and water. He reports bleeding was easily controlled. He denies numbness or tingling. He is not on blood thinners. He has full active range of motion of the knee without difficulty. He is walking without pain of difficulty. He denies pain currently. He does not know when his last tetanus shot was. He has no injury or complaint elsewhere.    HPI  Past Medical History:  Diagnosis Date  . HTN (hypertension)   . Hyperlipidemia   . OSA (obstructive sleep apnea) 06/09/2014    Patient Active Problem List   Diagnosis Date Noted  . OSA (obstructive sleep apnea) 06/09/2014    Past Surgical History:  Procedure Laterality Date  . LITHOTRIPSY         Home Medications    Prior to Admission medications   Medication Sig Start Date End Date Taking? Authorizing Provider  allopurinol (ZYLOPRIM) 300 MG tablet Take 300 mg by mouth daily.   Yes [provider]  aspirin EC 81 MG tablet Take 81 mg by mouth daily.   Yes [provider]  atorvastatin (LIPITOR) 80 MG tablet Take 80 mg by mouth daily.   Yes [provider]  celecoxib (CELEBREX) 200 MG capsule Take 200 mg by mouth daily.   Yes [provider]  HYDROcodone-acetaminophen (NORCO/VICODIN) 5-325 MG per tablet Take 0.5 tablets by mouth at bedtime as needed for moderate pain.    Yes [provider]  Omega-3 Fatty Acids (FISH OIL) 1000 MG CAPS Take 1,000 mg by mouth daily.    Yes [provider]  valsartan-hydrochlorothiazide (DIOVAN-HCT)  160-25 MG per tablet Take 1 tablet by mouth daily.   Yes [provider]  Vitamin D, Ergocalciferol, (DRISDOL) 50000 UNITS CAPS capsule Take 50,000 Units by mouth every 21 ( twenty-one) days. Last dose approx 12/27/14   Yes [provider]  cephALEXin (KEFLEX) 250 MG capsule Take 1 capsule (250 mg total) by mouth 4 (four) times daily. 07/18/17   Fread Kottke, PA-C  Ranitidine HCl (ZANTAC PO) Take 1 tablet by mouth daily as needed (indigestion).     [provider]    Family History Family History  Problem Relation Age of Onset  . Hypertension Father   . Macular degeneration Mother   . Sleep apnea Brother     Social History Social History  Substance Use Topics  . Smoking status: Former Smoker    Packs/day: 0.50    Years: 25.00    Types: Cigarettes    Quit date: 10/07/1993  . Smokeless tobacco: Never Used  . Alcohol use Yes     Comment: occassional     Allergies   Patient has no known allergies.   Review of Systems Review of Systems  Skin: Positive for wound.  Neurological: Negative for numbness.  Hematological: Does not bruise/bleed easily.     Physical Exam Updated Vital Signs BP (!) 142/79 (BP Location: Left Arm)   Pulse 66   Resp 16   Ht  (1.727 m)  Wt 70.3 kg (155 lb)   SpO2 100%   BMI 23.57 kg/m   Physical Exam  Constitutional: He is oriented to person, place, and time. He appears well-developed and well-nourished. No distress.  HENT:  Head: Normocephalic and atraumatic.  Eyes: EOM are normal.  Neck: Normal range of motion.  Pulmonary/Chest: Effort normal.  Abdominal: He exhibits no distension.  Musculoskeletal: Normal range of motion.  Full active range of motion of the knee without difficulty. Patient is ambulatory without difficulty. Strength of lower extremities intact bilaterally. Sensation intact bilaterally. Pedal pulses intact bilaterally.  Neurological: He is alert and oriented to person, place, and time.    Skin: Skin is warm. No rash noted.  Laceration of left knee, approximately 4 cm long, 2 mm deep. No obvious muscle or tendon damage. No obvious damage to the joint space. Minimal active bleeding.  Psychiatric: He has a normal mood and affect.  Nursing note and vitals reviewed.    ED Treatments / Results  Labs (all labs ordered are listed, but only abnormal results are displayed) Labs Reviewed - No data to display  EKG  EKG Interpretation None       Radiology No results found.  Procedures .Marland KitchenLaceration Repair Date/Time: 07/18/2017 4:25 PM Performed by: Alveria Apley Authorized by: Alveria Apley   Consent:    Consent obtained:  Verbal   Consent given by:  Patient   Risks discussed:  Infection, pain, poor cosmetic result, poor wound healing, tendon damage and vascular damage Anesthesia (see MAR for exact dosages):    Anesthesia method:  Local infiltration   Local anesthetic:  Lidocaine 2% WITH epi Laceration details:    Location:  Leg   Leg location:  L knee   Length (cm):  4   Depth (mm):  2 Repair type:    Repair type:  Simple Pre-procedure details:    Preparation:  Patient was prepped and draped in usual sterile fashion Exploration:    Wound exploration: wound explored through full range of motion and entire depth of wound probed and visualized     Wound exploration comment:  Wound explored to its fullest extent in a nonbloody field   Wound extent: no foreign bodies/material noted     Wound extent comment:  No obvious tendon or nerve damage   Contaminated: no   Treatment:    Area cleansed with:  Betadine and saline   Amount of cleaning:  Standard   Irrigation solution:  Sterile saline   Irrigation method:  Syringe Skin repair:    Repair method:  Sutures   Suture size:  5-0   Suture material:  Prolene   Suture technique:  Simple interrupted   Number of sutures:  7 Approximation:    Approximation:  Close Post-procedure details:    Dressing:   Sterile dressing   Patient tolerance of procedure:  Tolerated well, no immediate complications   (including critical care time)  Medications Ordered in ED Medications  Tdap (BOOSTRIX) injection 0.5 mL (0.5 mLs Intramuscular Given 07/18/17 1552)  lidocaine-EPINEPHrine (XYLOCAINE W/EPI) 2 %-1:200000 (PF) injection 10 mL (10 mLs Infiltration Given 07/18/17 1555)     Initial Impression / Assessment and Plan / ED Course  I have reviewed the triage vital signs and the nursing notes.  Pertinent labs & imaging results that were available during my care of the patient were reviewed by me and considered in my medical decision making (see chart for details).     Patient presenting with laceration of left knee  from a chainsaw. He is not on blood thinners. No obvious muscle tendon damage. Patient with full active range of motion of leg and no sensory loss. Into the torn without difficulty. Laceration was numbed, cleaned, and sutured. Tetanus given. Will place on abx as surrounding skin left as open wound, as wound was jagged. Pt to f/u with PCP in 10 days for suture removal. Discussed after care instructions. Return precautions given. Pt appears safe for discharge. Pt states he understands and agrees to plan.    Final Clinical Impressions(s) / ED Diagnoses   Final diagnoses:  Laceration of left knee, initial encounter    New Prescriptions New Prescriptions   CEPHALEXIN (KEFLEX) 250 MG CAPSULE    Take 1 capsule (250 mg total) by mouth 4 (four) times daily.     Alveria Apley, PA-C 07/18/17 1638    Bethann Berkshire, MD 07/18/17 2303

## 2017-11-28 ENCOUNTER — Encounter: Payer: Self-pay | Admitting: Pulmonary Disease

## 2017-11-28 ENCOUNTER — Ambulatory Visit (INDEPENDENT_AMBULATORY_CARE_PROVIDER_SITE_OTHER): Payer: Medicare Other | Admitting: Pulmonary Disease

## 2017-11-28 VITALS — BP 124/62 | HR 60 | Ht 67.0 in | Wt 161.2 lb

## 2017-11-28 DIAGNOSIS — G4733 Obstructive sleep apnea (adult) (pediatric): Secondary | ICD-10-CM | POA: Diagnosis not present

## 2017-11-28 DIAGNOSIS — Z9989 Dependence on other enabling machines and devices: Secondary | ICD-10-CM

## 2017-11-28 NOTE — Patient Instructions (Signed)
Can look up CPAP machine and mask options at CPAP.com or similar web site  Follow up in 1 year

## 2017-11-28 NOTE — Progress Notes (Signed)
[facility]  Chief Complaint  Patient presents with  . Follow-up    Pt doing well overall with cpap machine    Vital signs: BP 124/62 (BP Location: Left Arm, Cuff Size: Normal)   Pulse 60   Ht 5\' 7"  (1.702 m)   Wt 161 lb 3.2 oz (73.1 kg)   SpO2 98%   BMI 25.25 kg/m   History of Present Illness: Bradley Rose is a 72 y.o. male with obstructive sleep apnea.  He is doing well with CPAP.  In spite of losing weight, he still feels like CPAP helps.  He gets about 7.5 hrs of sleep.  He has full face mask, and this fits well.  He feels rested during the day.  His wife was recently dx with stage 1 breast cancer.  Physical Exam:  General - pleasant Eyes - pupils reactive ENT - no sinus tenderness, no oral exudate, no LAN Cardiac - regular, no murmur Chest - no wheeze, rales Abd - soft, non tender Ext - no edema Skin - no rashes Neuro - normal strength Psych - normal mood  Assessment/Plan:  Obstructive sleep apnea. - he is compliant with CPAP and reports benefit from therapy - continue auto CPAP   Patient Instructions  Can look up CPAP machine and mask options at CPAP.com or similar web site  Follow up in 1 year    Coralyn Helling, MD [facility]/Critical Care 11/28/2017, 9:30 AM Pager:  [phone]  Flow Sheet  Sleep tests: HST 08/08/14 >> AHI 49.9, SaO2 low 81% Auto CPAP 08/30/17 to 11/27/17 >> used on 90 of 90 nights with average 7 hrs 34 min.  Average AHI 3.6 with median CPAP 9 and 95 th percentile CPAP 13 cm H2O  Past Medical History: He  has a past medical history of HTN (hypertension), Hyperlipidemia, and OSA (obstructive sleep apnea) (06/09/2014).  Past Surgical History: He  has a past surgical history that includes Lithotripsy.  Family History: His family history includes Hypertension in his father; Macular degeneration in his mother; Sleep apnea in his brother.  Social History: He  reports that he quit  smoking about 24 years ago. His smoking use included cigarettes. He has a 12.50 pack-year smoking history. he has never used smokeless tobacco. He reports that he drinks alcohol. He reports that he does not use drugs.  Medications: Allergies as of 11/28/2017   No Known Allergies     Medication List        Accurate as of 11/28/17  9:30 AM. Always use your most recent med list.          allopurinol 300 MG tablet Commonly known as:  ZYLOPRIM Take 300 mg by mouth daily.   aspirin EC 81 MG tablet Take 81 mg by mouth daily.   atorvastatin 80 MG tablet Commonly known as:  LIPITOR Take 80 mg by mouth daily.   celecoxib 200 MG capsule Commonly known as:  CELEBREX Take 200 mg by mouth daily.   cephALEXin 250 MG capsule Commonly known as:  KEFLEX Take 1 capsule (250 mg total) by mouth 4 (four) times daily.   Fish Oil 1000 MG Caps Take 1,000 mg by mouth daily.   HYDROcodone-acetaminophen 5-325 MG tablet Commonly known as:  NORCO/VICODIN Take 0.5 tablets by mouth at bedtime as needed for moderate pain.   valsartan-hydrochlorothiazide 160-25 MG tablet Commonly known as:  DIOVAN-HCT Take 1 tablet by mouth daily.   Vitamin D (Ergocalciferol) 50000 units Caps  capsule Commonly known as:  DRISDOL Take 50,000 Units by mouth every 21 ( twenty-one) days. Last dose approx 12/27/14   ZANTAC PO Take 1 tablet by mouth daily as needed (indigestion).

## 2018-11-09 ENCOUNTER — Encounter: Payer: Self-pay | Admitting: Pulmonary Disease

## 2018-11-09 ENCOUNTER — Ambulatory Visit (INDEPENDENT_AMBULATORY_CARE_PROVIDER_SITE_OTHER): Payer: Medicare Other | Admitting: Pulmonary Disease

## 2018-11-09 VITALS — BP 114/74 | HR 76 | Ht 67.0 in | Wt 174.0 lb

## 2018-11-09 DIAGNOSIS — Z9989 Dependence on other enabling machines and devices: Secondary | ICD-10-CM

## 2018-11-09 DIAGNOSIS — G4733 Obstructive sleep apnea (adult) (pediatric): Secondary | ICD-10-CM

## 2018-11-09 NOTE — Patient Instructions (Signed)
Follow up in 1 year.

## 2018-11-09 NOTE — Progress Notes (Signed)
Bradley Rose, Bradley Rose, Bradley Rose  Chief Complaint  Patient presents with  . Follow-up    Pt is doing well overall with cpap machine.    Constitutional:  BP 114/74 (BP Location: Left Arm, Cuff Size: Normal)   Pulse 76   Ht 5\' 7"  (1.702 m)   Wt 174 lb (78.9 kg)   SpO2 96%   BMI 27.25 kg/m   Past Medical History:  HTN, HLD  Brief Summary:  Bradley Rose is a 73 y.o. male with obstructive sleep apnea.  He is doing well with CPAP.  Got new hybrid mask.  No longer having marks on his nose.  Gets occasional dry mouth, usually when mask leaks.  Not having sinus congestion, sore throat, or aerophagia.  Treated for pneumonia in December.  Feels better now.   Physical Exam:   Appearance - well kempt   ENMT - clear nasal mucosa, midline nasal  septum, no oral exudates, no LAN, trachea midline  Respiratory - normal chest wall, normal respiratory effort, no accessory muscle use, no wheeze/rales  CV - s1s2 regular rate Bradley rhythm, no murmurs, no peripheral edema, radial pulses symmetric  GI - soft, non tender, no masses  Lymph - no adenopathy noted in neck Bradley axillary areas  MSK - normal gait  Ext - no cyanosis, clubbing, or joint inflammation noted  Skin - no rashes, lesions, or ulcers  Neuro - normal strength, oriented x 3  Psych - normal mood Bradley affect   Assessment/Plan:   Obstructive sleep apnea. - he is compliant with therapy Bradley reports benefit - continue auto CPAP - he could have upper limit of auto CPAP reduced if air leak Bradley mouth dryness get worse    Patient Instructions  Follow up in 1 year    Coralyn HellingVineet Brigid Vandekamp, MD [employer] Rose/Bradley Rose Pager: [phone] 11/09/2018, 9:22 AM  Flow Sheet    Sleep tests:  HST 08/08/14 >> AHI 49.9, SaO2 low 81% Auto CPAP 08/11/18 to 11/08/18 >> used on 90 of 90 nights with average 8 hrs 4 min.  Average AHI 5.5 with median CPAP 10 Bradley 95 th percentile CPAP 14 cm H2O  Medications:    Allergies as of 11/09/2018   No Known Allergies     Medication List       Accurate as of November 09, 2018  9:22 AM. Always use your most recent med list.        allopurinol 300 MG tablet Commonly known as:  ZYLOPRIM Take 300 mg by mouth daily.   aspirin EC 81 MG tablet Take 81 mg by mouth daily.   atorvastatin 80 MG tablet Commonly known as:  LIPITOR Take 80 mg by mouth daily.   celecoxib 200 MG capsule Commonly known as:  CELEBREX Take 200 mg by mouth daily.   cephALEXin 250 MG capsule Commonly known as:  KEFLEX Take 1 capsule (250 mg total) by mouth 4 (four) times daily.   Fish Oil 1000 MG Caps Take 1,000 mg by mouth daily.   HYDROcodone-acetaminophen 5-325 MG tablet Commonly known as:  NORCO/VICODIN Take 0.5 tablets by mouth at bedtime as needed for moderate pain.   valsartan-hydrochlorothiazide 160-25 MG tablet Commonly known as:  DIOVAN-HCT Take 1 tablet by mouth daily.   Vitamin D (Ergocalciferol) 1.25 MG (50000 UT) Caps capsule Commonly known as:  DRISDOL Take 50,000 Units by mouth every 21 ( twenty-one) days. Last dose approx 12/27/14   ZANTAC PO Take 1 tablet by mouth daily as  needed (indigestion).       Past Surgical History:  He  has a past surgical history that includes Lithotripsy.  Family History:  His family history includes Hypertension in his father; Macular degeneration in his mother; Sleep apnea in his brother.  Social History:  He  reports that he quit smoking about 25 years ago. His smoking use included cigarettes. He has a 12.50 pack-year smoking history. He has never used smokeless tobacco. He reports current alcohol use. He reports that he does not use drugs.

## 2019-09-23 ENCOUNTER — Ambulatory Visit: Payer: Medicare Other | Attending: Internal Medicine

## 2019-09-23 DIAGNOSIS — Z20822 Contact with and (suspected) exposure to covid-19: Secondary | ICD-10-CM

## 2019-09-24 LAB — NOVEL CORONAVIRUS, NAA: SARS-CoV-2, NAA: NOT DETECTED

## 2019-10-29 ENCOUNTER — Ambulatory Visit: Payer: Medicare Other | Attending: Internal Medicine

## 2019-10-29 DIAGNOSIS — Z23 Encounter for immunization: Secondary | ICD-10-CM

## 2019-10-29 NOTE — Progress Notes (Signed)
   Covid-19 Vaccination Clinic  Name:  Bradley Rose    MRN: [id number] DOB: June 13, 1946  10/29/2019  [name] was observed post Covid-19 immunization for 15 minutes without incidence. He was provided with Vaccine Information Sheet and instruction to access the V-Safe system.   [name] was instructed to call 911 with any severe reactions post vaccine: Marland Kitchen Difficulty breathing  . Swelling of your face and throat  . A fast heartbeat  . A bad rash all over your body  . Dizziness and weakness    Immunizations Administered    Name Date Dose VIS Date Route   Pfizer COVID-19 Vaccine 10/29/2019  9:14 AM 0.3 mL 09/17/2019 Intramuscular   Manufacturer: [employer], [employer]   Lot: [id number]   NDC: [address]-4

## 2019-11-19 ENCOUNTER — Ambulatory Visit: Payer: Medicare Other | Attending: Internal Medicine

## 2019-11-19 DIAGNOSIS — Z23 Encounter for immunization: Secondary | ICD-10-CM | POA: Insufficient documentation

## 2019-11-19 NOTE — Progress Notes (Signed)
   Covid-19 Vaccination Clinic  Name:  Bradley Rose    MRN: [id number] DOB: 04/26/1946  11/19/2019  [name] was observed post Covid-19 immunization for 15 minutes without incidence. He was provided with Vaccine Information Sheet and instruction to access the V-Safe system.   Mr. Blades was instructed to call 911 with any severe reactions post vaccine: Marland Kitchen Difficulty breathing  . Swelling of your face and throat  . A fast heartbeat  . A bad rash all over your body  . Dizziness and weakness    Immunizations Administered    Name Date Dose VIS Date Route   Pfizer COVID-19 Vaccine 11/19/2019 10:15 AM 0.3 mL 09/17/2019 Intramuscular   Manufacturer: [employer], [employer]   Lot: [id number]   NDC: [address]-3

## 2020-04-25 ENCOUNTER — Other Ambulatory Visit: Payer: Self-pay | Admitting: Internal Medicine

## 2020-04-25 DIAGNOSIS — Z87442 Personal history of urinary calculi: Secondary | ICD-10-CM

## 2020-04-25 DIAGNOSIS — N1831 Chronic kidney disease, stage 3a: Secondary | ICD-10-CM

## 2020-04-26 ENCOUNTER — Ambulatory Visit
Admission: RE | Admit: 2020-04-26 | Discharge: 2020-04-26 | Disposition: A | Discharge status: Home / Self Care | Payer: Medicare Other | Source: Ambulatory Visit | Attending: Internal Medicine | Admitting: Internal Medicine

## 2020-04-26 DIAGNOSIS — Z87442 Personal history of urinary calculi: Secondary | ICD-10-CM

## 2020-04-26 DIAGNOSIS — N1831 Chronic kidney disease, stage 3a: Secondary | ICD-10-CM

## 2021-10-17 ENCOUNTER — Other Ambulatory Visit: Payer: Self-pay | Admitting: Orthopedic Surgery

## 2021-10-17 DIAGNOSIS — M25512 Pain in left shoulder: Secondary | ICD-10-CM

## 2021-11-05 ENCOUNTER — Ambulatory Visit
Admission: RE | Admit: 2021-11-05 | Discharge: 2021-11-05 | Disposition: A | Discharge status: Home / Self Care | Payer: Medicare Other | Source: Ambulatory Visit | Attending: Orthopedic Surgery | Admitting: Orthopedic Surgery

## 2021-11-05 DIAGNOSIS — M25512 Pain in left shoulder: Secondary | ICD-10-CM

## 2021-12-18 IMAGING — US US RENAL
1 series · 14 of 25 positions shown · non-contrast
Comparison: Renal ultrasound 09/21/2018

CLINICAL DATA: CKD.

EXAM:
RENAL / URINARY TRACT ULTRASOUND COMPLETE

[Series 1: us renal · 0.23mm/px · 14 of 39 slices shown]
[im 1/39]
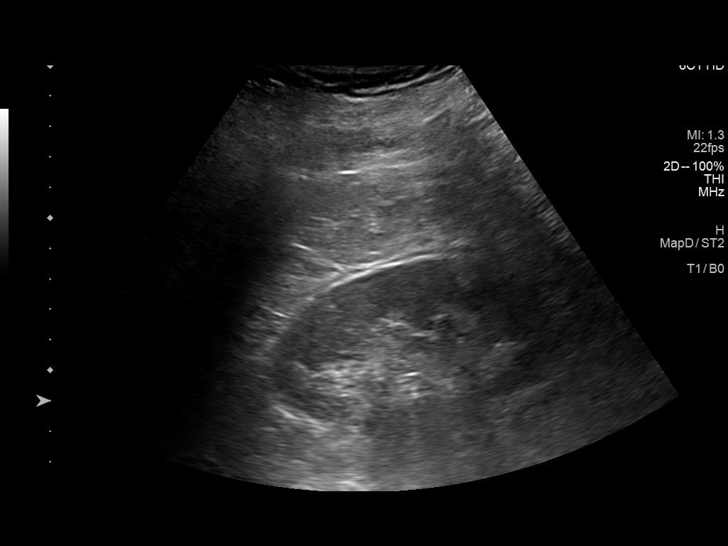
[im 4/39]
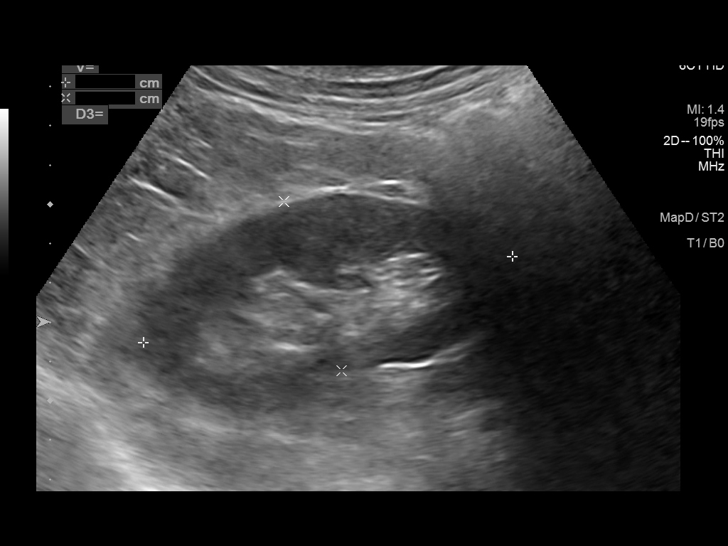
[im 7/39]
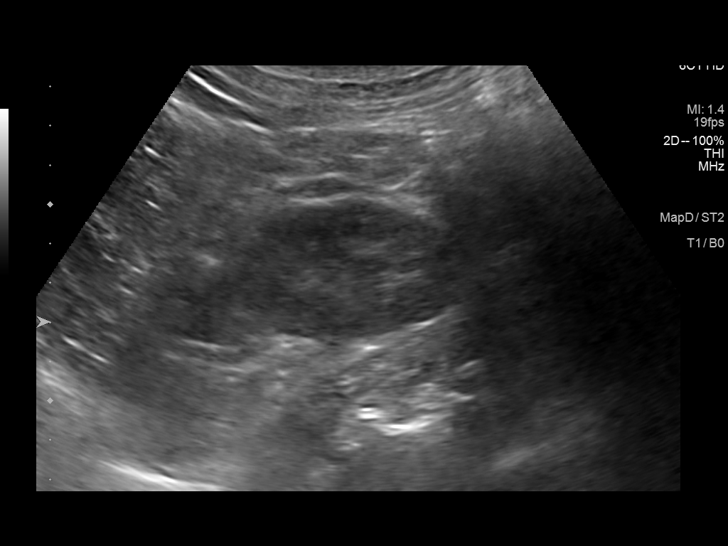
[im 10/39]
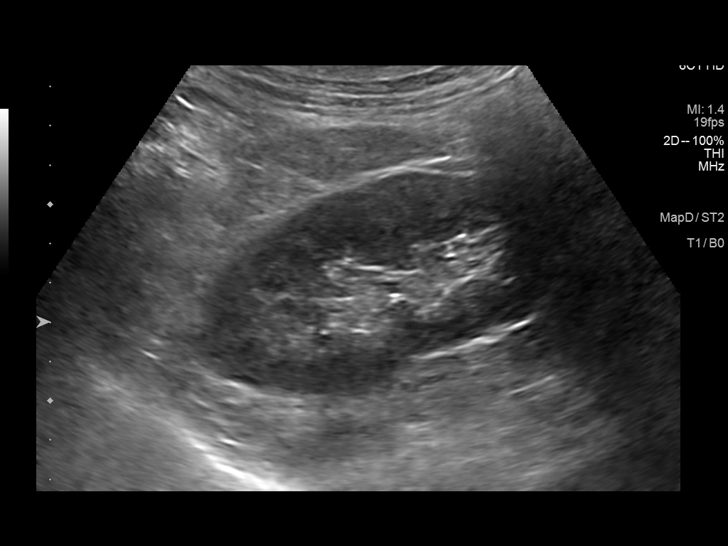
[im 13/39]
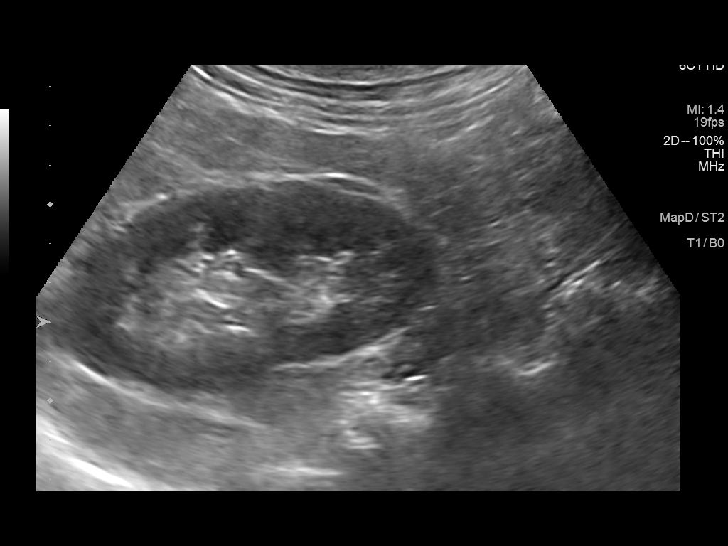
[im 15/39]
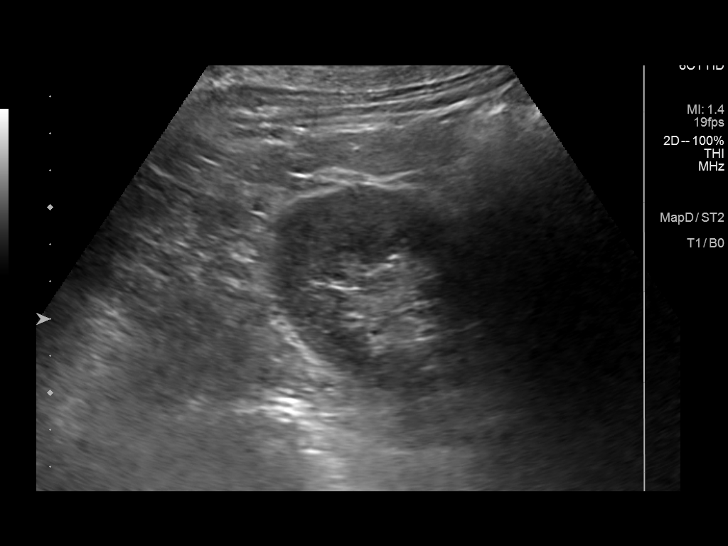
[im 18/39]
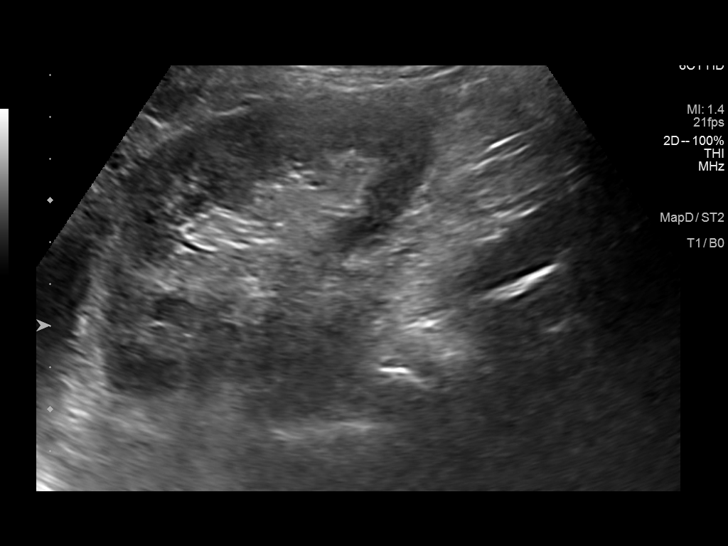
[im 21/39]
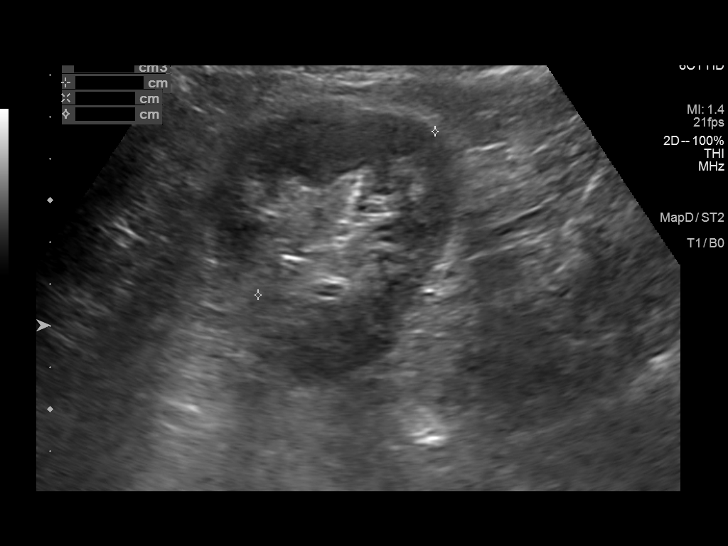
[im 24/39]
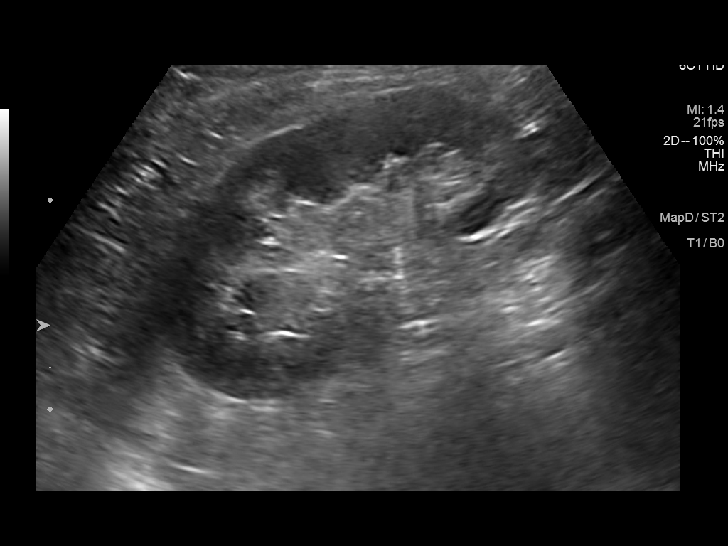
[im 26/39]
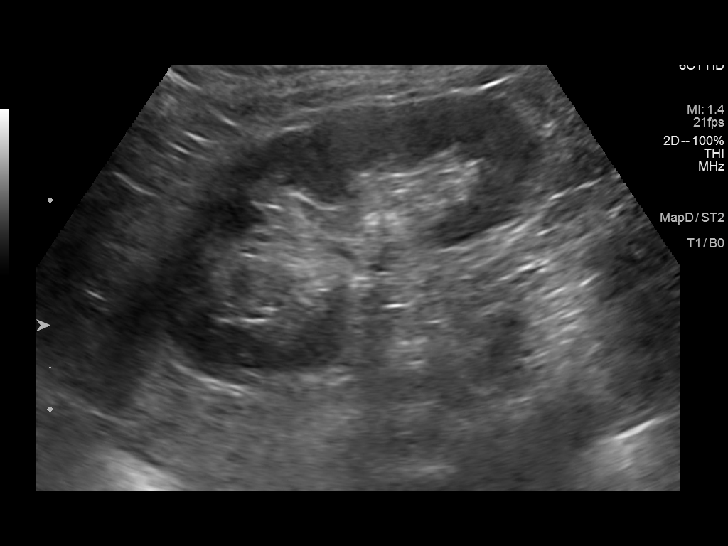
[im 29/39]
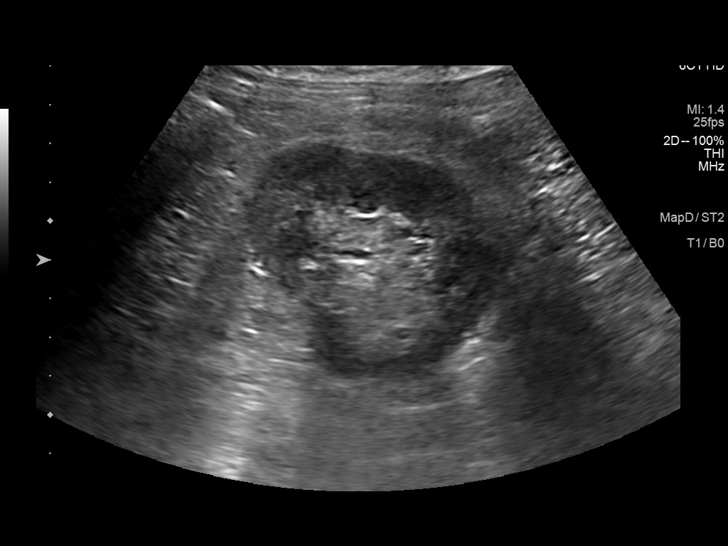
[im 32/39]
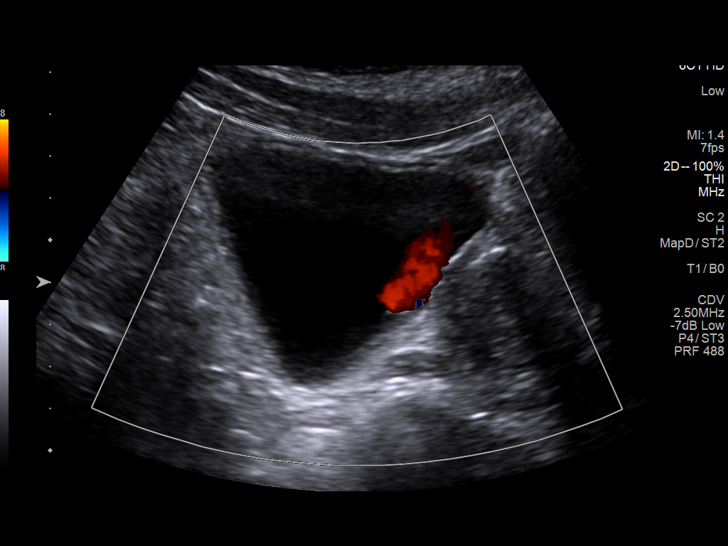
[im 35/39]
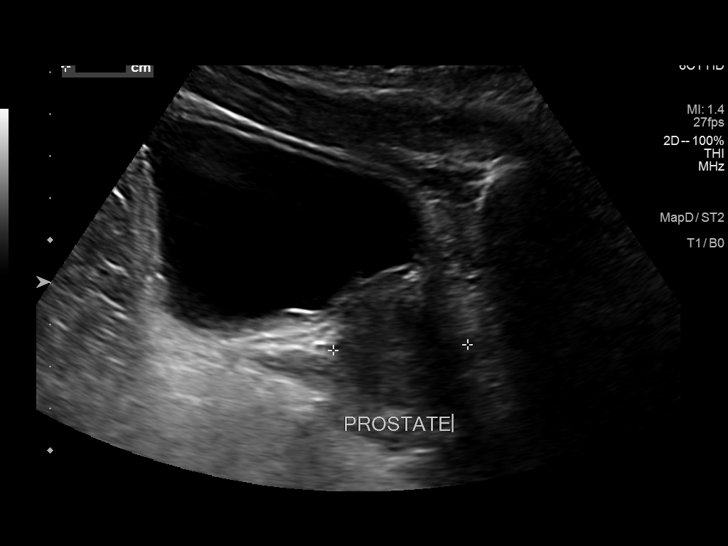
[im 39/39]
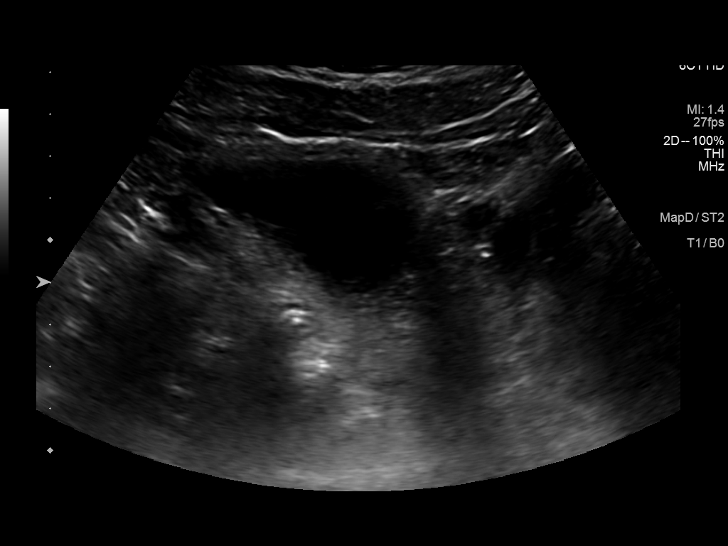

[14 of 25 positions shown; findings below may reference images not displayed]

FINDINGS: Right Kidney:

Renal measurements: 9.6 x 4.6 x 3.0 cm = volume: 113 mL. Renal
echogenicity is increased. No mass or hydronephrosis visualized.

Left Kidney:

Renal measurements: 10.2 x 4.1 x 5.8 cm = volume: 126 mL. Renal
echogenicity is increased. No mass or hydronephrosis visualized.

Bladder:

Appears normal for degree of bladder distention.

Other:

None.
IMPRESSION: No acute findings. Bilateral increased renal echogenicity as can be
seen in medical renal disease.

## 2021-12-25 ENCOUNTER — Ambulatory Visit (INDEPENDENT_AMBULATORY_CARE_PROVIDER_SITE_OTHER): Payer: Medicare Other | Admitting: Adult Health

## 2021-12-25 ENCOUNTER — Encounter: Payer: Self-pay | Admitting: Adult Health

## 2021-12-25 ENCOUNTER — Other Ambulatory Visit: Payer: Self-pay

## 2021-12-25 VITALS — BP 110/78 | HR 72 | Temp 98.2°F | Ht 67.0 in | Wt 178.0 lb

## 2021-12-25 DIAGNOSIS — G4733 Obstructive sleep apnea (adult) (pediatric): Secondary | ICD-10-CM

## 2021-12-25 NOTE — Progress Notes (Signed)
? ?@Patient ID: Bradley Rose, male    DOB: 03/02/1946, 76 y.o.   MRN: [id number] ? ?Chief Complaint  ?Patient presents with  ? Consult  ? ? ?Referring provider: ?Burnett, Brent A, MD ? ?HPI: ?76-year-old male followed for severe obstructive sleep apnea on nocturnal CPAP ? ?TEST/EVENTS :  ?HST 08/08/14 >> AHI 49.9, SaO2 low 81% ? ?12/25/2021 Follow up : OSA  ?Patient presents for a follow-up visit to reestablish for sleep apnea. Patient was last seen February 2020.  He has underlying severe obstructive sleep apnea.  Is on nocturnal CPAP. ?Patient says he forgot to follow-up.  And time got away from him. Wife got sick with breast cancer.  ?Patient says he wears his CPAP every single night.  Usually for about 8 hours.  Typically goes to bed about 10 PM.  Goes to sleep very quickly.  Is up only about once or twice at nighttime.  Typically gets up about 7:30 AM.  Patient is retired and does not operate heavy machinery.  As above sleep study in 2015 showed severe obstructive sleep apnea with AHI of 49.9/hour and SPO2 low was 81%. ?Patient has no symptoms suspicious for cataplexy or sleep paralysis. ?CPAP download shows excellent compliance with daily average usage at 8hrs . On autoCPAP 5 to 15cm , AHI 9.9/hr. Min leaks.  ?Epworth score is 2.  Patient says he feels pretty well with CPAP.  Cannot sleep without it.  Feels that he benefits from CPAP with decreased daytime sleepiness. Does not have any significant sleepiness. Uses full face mask.  ?Uses Hydrocodone in evenings  for shoulder pain. Going for shoulder surgery next week.  ?No history of CVA or CHF.  ?Needs order for new CPAP machine. His machine says has met motor life expectancy .  ?Says CPAP pressure is good, does not want it any higher . He has a beard and mask slips some.  ? ?PMH positive for hypertension and hyperlipidemia, kidney stones, shoulder DJD ? ?SH patient is a former smoker.  Quit 27 years ago.  Drinks alcohol socially. He lives with his wife.  He  is retired.  He does have adult children. Very active, works on his farm.  ? ?Surgical hx : None  ? ?FH positive for hypertension, sleep apnea and macular degeneration ? ? ? ? ?No Known Allergies ? ?Immunization History  ?Administered Date(s) Administered  ? Influenza Split 07/28/2017, 07/09/2018  ? Influenza Whole 06/09/2013  ? Influenza, High Dose Seasonal PF 07/26/2014, 08/13/2016, 07/16/2017, 07/29/2018, 07/09/2019, 06/13/2021  ? Influenza,inj,Quad PF,6+ Mos 07/08/2015  ? Influenza-Unspecified 07/28/2014  ? PFIZER Comirnaty(Gray Top)Covid-19 Tri-Sucrose Vaccine 08/07/2020  ? PFIZER(Purple Top)SARS-COV-2 Vaccination 10/29/2019, 11/19/2019  ? Pneumococcal Conjugate-13 07/26/2014  ? Tdap 07/18/2017  ? Zoster, Live 06/09/2013  ? Zoster, Unspecified 07/09/2019, 09/13/2019  ? ? ?Past Medical History:  ?Diagnosis Date  ? HTN (hypertension)   ? Hyperlipidemia   ? OSA (obstructive sleep apnea) 06/09/2014  ? ? ?Tobacco History: ?Social History  ? ?Tobacco Use  ?Smoking Status Former  ? Packs/day: 0.50  ? Years: 25.00  ? Pack years: 12.50  ? Types: Cigarettes  ? Quit date: 10/07/1993  ? Years since quitting: 28.2  ?Smokeless Tobacco Never  ? ?Counseling given: Not Answered ? ? ?Outpatient Medications Prior to Visit  ?Medication Sig Dispense Refill  ? allopurinol (ZYLOPRIM) 300 MG tablet Take 300 mg by mouth daily.    ? aspirin EC 81 MG tablet Take 81 mg by mouth daily.    ? atorvastatin (  LIPITOR) 80 MG tablet Take 80 mg by mouth daily.    ? celecoxib (CELEBREX) 200 MG capsule Take 200 mg by mouth daily.    ? HYDROcodone-acetaminophen (NORCO/VICODIN) 5-325 MG per tablet Take 0.5 tablets by mouth at bedtime as needed for moderate pain.     ? Omega-3 Fatty Acids (FISH OIL) 1000 MG CAPS Take 1,000 mg by mouth daily.     ? Ranitidine HCl (ZANTAC PO) Take 1 tablet by mouth daily as needed (indigestion).     ? valsartan-hydrochlorothiazide (DIOVAN-HCT) 160-25 MG per tablet Take 1 tablet by mouth daily.    ? Vitamin D, Ergocalciferol,  (DRISDOL) 50000 UNITS CAPS capsule Take 50,000 Units by mouth every 21 ( twenty-one) days. Last dose approx 12/27/14    ? cephALEXin (KEFLEX) 250 MG capsule Take 1 capsule (250 mg total) by mouth 4 (four) times daily. (Patient not taking: Reported on 12/25/2021) 28 capsule 0  ? ?No facility-administered medications prior to visit.  ? ? ? ?Review of Systems:  ? ?Constitutional:   No  weight loss, night sweats,  Fevers, chills,  ?+fatigue, or  lassitude. ? ?HEENT:   No headaches,  Difficulty swallowing,  Tooth/dental problems, or  Sore throat,  ?              No sneezing, itching, ear ache, nasal congestion, post nasal drip,  ? ?CV:  No chest pain,  Orthopnea, PND, swelling in lower extremities, anasarca, dizziness, palpitations, syncope.  ? ?GI  No heartburn, indigestion, abdominal pain, nausea, vomiting, diarrhea, change in bowel habits, loss of appetite, bloody stools.  ? ?Resp:  No chest wall deformity ? ?Skin: no rash or lesions. ? ?GU: no dysuria, change in color of urine, no urgency or frequency.  No flank pain, no hematuria  ? ?MS:  No joint pain or swelling.  No decreased range of motion.  No back pain. ? ? ? ?Physical Exam ? ?BP 110/78 (BP Location: Left Arm, Patient Position: Sitting, Cuff Size: Normal)   Pulse 72   Temp 98.2 ?F (36.8 ?C) (Oral)   Ht 5' 7" (1.702 m)   Wt 178 lb (80.7 kg)   SpO2 98%   BMI 27.88 kg/m?  ? ?GEN: A/Ox3; pleasant , NAD, well nourished  ?  ?HEENT:  [place]/AT,  NOSE-clear, THROAT-clear, no lesions, no postnasal drip or exudate noted.  Class III MP airway ? ?NECK:  Supple w/ fair ROM; no JVD; normal carotid impulses w/o bruits; no thyromegaly or nodules palpated; no lymphadenopathy.   ? ?RESP  Clear  P & A; w/o, wheezes/ rales/ or rhonchi. no accessory muscle use, no dullness to percussion ? ?CARD:  RRR, no m/r/g, no peripheral edema, pulses intact, no cyanosis or clubbing. ? ?GI:   Soft & nt; nml bowel sounds; no organomegaly or masses detected.  ? ?Musco: Warm bil, no deformities  or joint swelling noted.  ? ?Neuro: alert, no focal deficits noted.   ? ?Skin: Warm, no lesions or rashes ? ? ? ?Lab Results: ? ?CBC ?No results found for: WBC, RBC, HGB, HCT, PLT, MCV, MCH, MCHC, RDW, LYMPHSABS, MONOABS, EOSABS, BASOSABS ? ?BMET ?No results found for: NA, K, CL, CO2, GLUCOSE, BUN, CREATININE, CALCIUM, GFRNONAA, GFRAA ? ?BNP ?No results found for: BNP ? ?ProBNP ?No results found for: PROBNP ? ?Imaging: ?No results found. ? ? ? ?No flowsheet data found. ? ?No results found for: NITRICOXIDE ? ? ? ? ? ?Assessment & Plan:  ? ?OSA (obstructive sleep apnea) ?Severe obstructive sleep   apnea with excellent control and compliance on nocturnal CPAP. ?- discussed how weight can impact sleep and risk for sleep disordered breathing ?- discussed options to assist with weight loss: combination of diet modification, cardiovascular and strength training exercises ?  ?- had an extensive discussion regarding the adverse health consequences related to untreated sleep disordered breathing ?- specifically discussed the risks for hypertension, coronary artery disease, cardiac dysrhythmias, cerebrovascular disease, and diabetes ?- lifestyle modification discussed ?  ?- discussed how sleep disruption can increase risk of accidents, particularly when driving ?- safe driving practices were discussed ?  ?Patient is having a few residual sleep apneic episodes.  Clinically he is not having any symptoms.  We will place an order for his new CPAP machine .  Once he obtains this will have a follow-up visit and download. ? ? ?Plan  ?Patient Instructions  ?Order for new CPAP machine.  ?Continue on CPAP at bedtime ?Keep up the good work ?Work on healthy weight ?Do not drive if sleepy ?Remain active ?Follow-up with Dr. Halford Chessman in 1 year and as needed ?  ? ? ? ? ?Rexene Edison, NP ?12/25/2021 ? ?

## 2021-12-25 NOTE — Patient Instructions (Addendum)
Order for new CPAP machine.  ?Continue on CPAP at bedtime ?Keep up the good work ?Work on healthy weight ?Do not drive if sleepy ?Remain active ?Follow-up with Dr. Craige Cotta in 1 year and as needed ?

## 2021-12-25 NOTE — Assessment & Plan Note (Addendum)
Severe obstructive sleep apnea with excellent control and compliance on nocturnal CPAP. ?- discussed how weight can impact sleep and risk for sleep disordered breathing ?- discussed options to assist with weight loss: combination of diet modification, cardiovascular and strength training exercises ?  ?- had an extensive discussion regarding the adverse health consequences related to untreated sleep disordered breathing ?- specifically discussed the risks for hypertension, coronary artery disease, cardiac dysrhythmias, cerebrovascular disease, and diabetes ?- lifestyle modification discussed ?  ?- discussed how sleep disruption can increase risk of accidents, particularly when driving ?- safe driving practices were discussed ?  ?Patient is having a few residual sleep apneic episodes.  Clinically he is not having any symptoms.  We will place an order for his new CPAP machine .  Once he obtains this will have a follow-up visit and download. ? ? ?Plan  ?Patient Instructions  ?Order for new CPAP machine.  ?Continue on CPAP at bedtime ?Keep up the good work ?Work on healthy weight ?Do not drive if sleepy ?Remain active ?Follow-up with Dr. Craige Cotta in 1 year and as needed ?  ? ?

## 2021-12-26 ENCOUNTER — Other Ambulatory Visit: Payer: Self-pay

## 2021-12-26 ENCOUNTER — Encounter (HOSPITAL_BASED_OUTPATIENT_CLINIC_OR_DEPARTMENT_OTHER): Payer: Self-pay | Admitting: Orthopaedic Surgery

## 2021-12-26 ENCOUNTER — Encounter (HOSPITAL_BASED_OUTPATIENT_CLINIC_OR_DEPARTMENT_OTHER)
Admission: RE | Admit: 2021-12-26 | Discharge: 2021-12-26 | Disposition: A | Discharge status: Home / Self Care | Payer: Medicare Other | Source: Ambulatory Visit | Attending: Orthopaedic Surgery | Admitting: Orthopaedic Surgery

## 2021-12-26 DIAGNOSIS — I10 Essential (primary) hypertension: Secondary | ICD-10-CM | POA: Diagnosis not present

## 2021-12-26 DIAGNOSIS — Z01818 Encounter for other preprocedural examination: Secondary | ICD-10-CM | POA: Insufficient documentation

## 2021-12-26 LAB — BASIC METABOLIC PANEL
Anion gap: 7 (ref 5–15)
BUN: 31 mg/dL — ABNORMAL HIGH (ref 8–23)
CO2: 27 mmol/L (ref 22–32)
Calcium: 9.3 mg/dL (ref 8.9–10.3)
Chloride: 104 mmol/L (ref 98–111)
Creatinine, Ser: 1.05 mg/dL (ref 0.61–1.24)
GFR, Estimated: >60 mL/min (ref >60)
Glucose, Bld: 104 mg/dL — ABNORMAL HIGH (ref 70–99)
Potassium: 5.2 mmol/L — ABNORMAL HIGH (ref 3.5–5.1)
Sodium: 138 mmol/L (ref 135–145)

## 2021-12-26 LAB — SURGICAL PCR SCREEN
MRSA, PCR: NEGATIVE
Staphylococcus aureus: NEGATIVE

## 2021-12-26 NOTE — Progress Notes (Signed)
? ? ? ? ? ?  Patient Instructions ? ?The night before surgery:  ?No food after midnight. ONLY clear liquids after midnight ? ?The day of surgery (if you do NOT have diabetes):  ?Drink ONE (1) Pre-Surgery Clear Ensure as directed.   ?This drink was given to you during your hospital  ?pre-op appointment visit. ?The pre-op nurse will instruct you on the time to drink the  ?Pre-Surgery Ensure depending on your surgery time. ?Finish the drink at the designated time by the pre-op nurse.  ?Nothing else to drink after completing the  ?Pre-Surgery Clear Ensure. ? ?The day of surgery (if you have diabetes): ?Drink ONE (1) Gatorade 2 (G2) as directed. ?This drink was given to you during your hospital  ?pre-op appointment visit.  ?The pre-op nurse will instruct you on the time to drink the  ? Gatorade 2 (G2) depending on your surgery time. ?Color of the Gatorade may vary. Red is not allowed. ?Nothing else to drink after completing the  ?Gatorade 2 (G2). ? ?       If you have questions, please contact your surgeon?s office.  ? ?Surgical soap and BPO given to patient, verbalizes understanding. ?

## 2021-12-26 NOTE — Progress Notes (Signed)
Reviewed and agree with assessment/plan. ? ? ?Delesha Pohlman, MD ? Pulmonary/Critical Care ?12/26/2021, 8:06 AM ?Pager:  [phone] ? ?

## 2021-12-27 NOTE — Progress Notes (Signed)
K+5.2, Dr. Tacy Dura aware, will recheck day of surgery.  ?

## 2022-01-02 NOTE — H&P (Signed)
? ? ?PREOPERATIVE H&P ? ?Chief Complaint: LEFT DJD SHOULDER ? ?HPI: ?Bradley Rose is a 76 y.o. male who is scheduled for Procedure(s): ?REVERSE SHOULDER ARTHROPLASTY.  ? 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 He has a past medical history significant for HTN, HLD, OSA, GERD. He has had bilateral shoulder pain, left greater than right for some time. He takes a low dose of Celebrex in the mornings. He states he is overall able to use the arm but he is very dysfunctional. He has had injections and failed. He has had non-operative treatment for some time. ? ?His symptoms are rated as moderate to severe, and have been worsening.  This is significantly impairing activities of daily living.   ? ?Please see clinic note for further details on this patient's care.   ? ?He has elected for surgical management.  ? ?Past Medical History:  ?Diagnosis Date  ? Arthritis   ? DJD left shoulder  ? GERD (gastroesophageal reflux disease)   ? OTC prn  ? History of kidney stones   ? HTN (hypertension)   ? Hyperlipidemia   ? OSA (obstructive sleep apnea) 06/09/2014  ? uses CPAP nightly  ? ?Past Surgical History:  ?Procedure Laterality Date  ? COLONOSCOPY    ? LITHOTRIPSY    ? PATELLA FRACTURE SURGERY Right   ? when 76yrs old  ? WRIST FRACTURE SURGERY Left   ? when 76yrs old  ? ?Social History  ? ?Socioeconomic History  ? Marital status: Married  ?  Spouse name: Not on file  ? Number of children: Not on file  ? Years of education: Not on file  ? Highest education level: Not on file  ?Occupational History  ? Occupation: Retired  ?  Employer: [employer]  ?Tobacco Use  ? Smoking status: Former  ?  Packs/day: 0.50  ?  Years: 25.00  ?  Pack years: 12.50  ?  Types: Cigarettes  ?  Quit date: 10/07/1993  ?  Years since quitting: 28.2  ? Smokeless tobacco: Never  ?Vaping Use  ? Vaping Use: Never used  ?Substance and Sexual Activity  ? Alcohol use: Yes  ?  Comment: social, sometimes 1-2  beers/d  ? Drug use: No  ? Sexual activity: Not on file  ?Other Topics Concern  ? Not on file  ?Social History Narrative  ? Not on file  ? ?Social Determinants of Health  ? ?Financial Resource Strain: Not on file  ?Food Insecurity: Not on file  ?Transportation Needs: Not on file  ?Physical Activity: Not on file  ?Stress: Not on file  ?Social Connections: Not on file  ? ?Family History  ?Problem Relation Age of Onset  ? Hypertension Father   ? Macular degeneration Mother   ? Sleep apnea Brother   ? ?No Known Allergies ?Prior to Admission medications   ?Medication Sig Start Date End Date Taking? Authorizing Provider  ?allopurinol (ZYLOPRIM) 300 MG tablet Take 300 mg by mouth daily.   Yes [provider]  ?celecoxib (CELEBREX) 200 MG capsule Take 200 mg by mouth daily.   Yes [provider]  ?HYDROcodone-acetaminophen (NORCO/VICODIN) 5-325 MG per tablet Take 0.5 tablets by mouth at bedtime as needed for moderate pain.    Yes [provider]  ?Omega-3 Fatty Acids (FISH OIL) 1000 MG CAPS Take 1,000 mg by mouth daily.    Yes [provider]  ?tamsulosin (FLOMAX) 0.4 MG CAPS capsule Take  0.4 mg by mouth daily after breakfast.   Yes [provider]  ?valsartan-hydrochlorothiazide (DIOVAN-HCT) 160-25 MG per tablet Take 1 tablet by mouth daily.   Yes [provider]  ?vitamin B-12 (CYANOCOBALAMIN) 1000 MCG tablet Take 1,000 mcg by mouth daily.   Yes [provider]  ?Vitamin D, Ergocalciferol, (DRISDOL) 50000 UNITS CAPS capsule Take 50,000 Units by mouth every 21 ( twenty-one) days. Last dose approx 12/27/14   Yes [provider]  ?Ranitidine HCl (ZANTAC PO) Take 1 tablet by mouth daily as needed (indigestion).     [provider]  ? ? ?ROS: All other systems have been reviewed and were otherwise negative with the exception of those mentioned in the HPI and as above. ? ?Physical Exam: ?General: Alert, no acute distress ?Cardiovascular: No pedal  edema ?Respiratory: No cyanosis, no use of accessory musculature ?GI: No organomegaly, abdomen is soft and non-tender ?Skin: No lesions in the area of chief complaint ?Neurologic: Sensation intact distally ?Psychiatric: Patient is competent for consent with normal mood and affect ?Lymphatic: No axillary or cervical lymphadenopathy ? ?MUSCULOSKELETAL:  ?The range of motion of the bilateral shoulders is to about 70-80 degrees. External rotation to 0. Internal rotation to the front pocket.  He has crepitus with range of motion.  ? 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  ? ?BMI: ?Body mass index is 28.73 kg/m?. ? ?No results found for: ALBUMIN ? ? ?Diabetes: ?Patient does not have a diagnosis of diabetes. ?  ? ?.  ?  ?Smoking Status: ?Social History  ? ?Tobacco Use  ?Smoking Status Former  ? Packs/day: 0.50  ? Years: 25.00  ? Pack years: 12.50  ? Types: Cigarettes  ? Quit date: 10/07/1993  ? Years since quitting: 28.2  ?Smokeless Tobacco Never  ? ?Counseling given: Not Answered ? ? ?Assessment: ?LEFT DJD SHOULDER ? ?Plan: ?Plan for Procedure(s): ?REVERSE SHOULDER ARTHROPLASTY ? ?The risks benefits and alternatives were discussed with the patient including but not limited to the risks of nonoperative treatment, versus surgical intervention including infection, bleeding, nerve injury,  blood clots, cardiopulmonary complications, morbidity, mortality, among others, and they were willing to proceed.  ? ?We additionally specifically discussed risks of axillary nerve injury, infection, periprosthetic fracture, continued pain and longevity of implants prior to beginning procedure.  ?  ?Patient will be closely monitored in PACU for medical stabilization and pain control. If found stable in PACU, patient may be discharged home with outpatient follow-up. If any concerns regarding patient's  stabilization patient will be admitted for observation after surgery. The patient is planning to be discharged home with outpatient PT.  ? ?The patient acknowledged the explanation, agreed to proceed with the plan and consent was signed.  ? ?He received operative clearance from his PCP, Dr. Benedetto Goad.  ? ?Operative Plan: Reverse total shoulder arthroplasty with an augmented  implant ?Discharge Medications: Tylenol, Celebrex, Gabapentin, Oxycodone, Zofran ?DVT Prophylaxis: Aspirin ?Physical Therapy: Outpatient PT ?Special Discharge needs: Sling. [employer] ? ? ?Vernetta Honey, PA-C ? ?01/02/2022 ?3:02 PM ? ?

## 2022-01-02 NOTE — Discharge Instructions (Addendum)
Ramond Marrow MD, MPH ?Alfonse Alpers, PA-C ?[facility] ?[address] ?[phone] (tel)   ?[phone] (fax) ? ? ?POST-OPERATIVE INSTRUCTIONS - TOTAL SHOULDER REPLACEMENT  ? ? ?WOUND CARE ?You may leave the operative dressing in place until your follow-up appointment. ?KEEP THE INCISIONS CLEAN AND DRY. ?There may be a small amount of fluid/bleeding leaking at the surgical site. This is normal after surgery.  ?If it fills with liquid or blood please call us immediately to change it for you. ?Use the provided ice machine or Ice packs as often as possible for the first 3-4 days, then as needed for pain relief.   ?Keep a layer of cloth or a shirt between your skin and the cooling unit to prevent frost bite as it can get very cold. ? ?SHOWERING: ?- You may shower on Post-Op Day #2.  ?- The dressing is water resistant but do not scrub it as it may start to peel up.   ?- You may remove the sling for showering ?- Gently pat the area dry.  ?- Do not soak the shoulder in water. Do not go swimming in the pool or ocean until your incision has completely healed (about 4-6 weeks after surgery.) ?- KEEP THE INCISIONS CLEAN AND DRY. ? ?EXERCISES ?Wear the sling at all times ?You may remove the sling for showering, but keep the arm across the chest or in a secondary sling.    ?Accidental/Purposeful External Rotation and shoulder flexion (reaching behind you) is to be avoided at all costs for the first month. ?It is ok to come out of your sling if your are sitting and have assistance for eating.   ?Do not lift anything heavier than 1 pound until we discuss it further in clinic. ? ?REGIONAL ANESTHESIA (NERVE BLOCKS) ?The anesthesia team may have performed a nerve block for you if safe in the setting of your care.  This is a great tool used to minimize pain.  Typically the block may start wearing off overnight but the long acting medicine may last for 3-4 days.  The nerve block wearing off can be a  challenging period but please utilize your as needed pain medications to try and manage this period.   ? ?POST-OP MEDICATIONS- Multimodal approach to pain control ?In general your pain will be controlled with a combination of substances.  Prescriptions unless otherwise discussed are electronically sent to your pharmacy.  This is a carefully made plan we use to minimize narcotic use.    ? ?Celebrex - Anti-inflammatory medication taken on a scheduled basis ?Acetaminophen - Non-narcotic pain medicine taken on a scheduled basis  ?Gabapentin - this is a medication to help with pain, take on a scheduled basis ?Oxycodone - This is a strong narcotic, to be used only on an ?as needed? basis for SEVERE pain. ?Aspirin 81mg  - This medicine is used to minimize the risk of blood clots after surgery. ?Omeprazole - daily medicine to protect your stomach while taking anti-inflammatories.  ?Zofran -  take as needed for nausea ? ?FOLLOW-UP ?If you develop a Fever (>101.5), Redness or Drainage from the surgical incision site, please call our office to arrange for an evaluation. ?Please call the office to schedule a follow-up appointment for a wound check, 7-10 days post-operatively. ? ?IF YOU HAVE ANY QUESTIONS, PLEASE FEEL FREE TO CALL OUR OFFICE. ? ?HELPFUL INFORMATION ? ?If you had a block, it will wear off between 8-24 hrs postop typically.  This is period when  your pain may go from nearly zero to the pain you would have had post-op without the block.  This is an abrupt transition but nothing dangerous is happening.  You may take an extra dose of narcotic when this happens. ? ?Your arm will be in a sling following surgery. You will be in this sling for the next 4 weeks.   ? ?You may be more comfortable sleeping in a semi-seated position the first few nights following surgery.  Keep a pillow propped under the elbow and forearm for comfort.  If you have a recliner type of chair it might be beneficial.  If not that is fine too, but it  would be helpful to sleep propped up with pillows behind your operated shoulder as well under your elbow and forearm.  This will reduce pulling on the suture lines. ? ?When dressing, put your operative arm in the sleeve first.  When getting undressed, take your operative arm out last.  Loose fitting, button-down shirts are recommended. ? ?In most states it is against the law to drive while your arm is in a sling. And certainly against the law to drive while taking narcotics. ? ?You may return to work/school in the next couple of days when you feel up to it. Desk work and typing in the sling is fine. ? ?We suggest you use the pain medication the first night prior to going to bed, in order to ease any pain when the anesthesia wears off. You should avoid taking pain medications on an empty stomach as it will make you nauseous. ? ?Do not drink alcoholic beverages or take illicit drugs when taking pain medications. ? ?Pain medication may make you constipated.  Below are a few solutions to try in this order: ?Decrease the amount of pain medication if you aren?t having pain. ?Drink lots of decaffeinated fluids. ?Drink prune juice and/or each dried prunes ? ?If the first 3 don?t work start with additional solutions ?Take Colace - an over-the-counter stool softener ?Take Senokot - an over-the-counter laxative ?Take Miralax - a stronger over-the-counter laxative ? ? ?Dental Antibiotics: ? ?In most cases prophylactic antibiotics for Dental procdeures after total joint surgery are not necessary. ? ?Exceptions are as follows: ? ?1. History of prior total joint infection ? ?2. Severely immunocompromised (Organ Transplant, cancer chemotherapy, Rheumatoid biologic ?meds such as Humera) ? ?3. Poorly controlled diabetes (A1C &gt; 8.0, blood glucose over 200) ? ?If you have one of these conditions, contact your surgeon for an antibiotic prescription, prior to your ?dental procedure. ? ? ?For more information including helpful videos  and documents visit our website:  ? ?[email or web address] ? ? ? ?May have Tylenol again after 11am ? ? ?DeRoyal Abductor sling instructions and diagram: (Blue ball) ? ?Youtube video: https://youtu.be/[email or web address] ? ?Please contact your surgeon's office if you have any questions about this shoulder immobilizer. ? ? ? ? ? ?  ? ? ?Post Anesthesia Home Care Instructions ? ?Activity: ?Get plenty of rest for the remainder of the day. A responsible individual must stay with you for 24 hours following the procedure.  ?For the next 24 hours, DO NOT: ?-Drive a car ?-Advertising copywriterperate machinery ?-Drink alcoholic beverages ?-Take any medication unless instructed by your physician ?-Make any legal decisions or sign important papers. ? ?Meals: ?Start with liquid foods such as gelatin or soup. Progress to regular foods as tolerated. Avoid greasy, spicy, heavy foods. If nausea and/or vomiting occur, drink only clear liquids until the nausea and/or vomiting subsides. Call  your physician if vomiting continues. ? ?Special Instructions/Symptoms: ?Your throat may feel dry or sore from the anesthesia or the breathing tube placed in your throat during surgery. If this causes discomfort, gargle with warm salt water. The discomfort should disappear within 24 hours. ? ?   Regional Anesthesia Blocks ? ?1. Numbness or the inability to move the "blocked" extremity may last from 3-48 hours after placement. The length of time depends on the medication injected and your individual response to the medication. If the numbness is not going away after 48 hours, call your surgeon. ? ?2. The extremity that is blocked will need to be protected until the numbness is gone and the  Strength has returned. Because you cannot feel it, you will need to take extra care to avoid injury. Because it may be weak, you may have difficulty moving it or using it. You may not know what position it is in without looking at it while the block is in  effect. ? ?3. For blocks in the legs and feet, returning to weight bearing and walking needs to be done carefully. You will need to wait until the numbness is entirely gone and the strength has returned.

## 2022-01-03 ENCOUNTER — Other Ambulatory Visit: Payer: Self-pay

## 2022-01-03 ENCOUNTER — Ambulatory Visit (HOSPITAL_BASED_OUTPATIENT_CLINIC_OR_DEPARTMENT_OTHER): Payer: Medicare Other | Admitting: Certified Registered"

## 2022-01-03 ENCOUNTER — Ambulatory Visit (HOSPITAL_BASED_OUTPATIENT_CLINIC_OR_DEPARTMENT_OTHER)
Admission: RE | Admit: 2022-01-03 | Discharge: 2022-01-03 | Disposition: A | Discharge status: Home / Self Care | Payer: Medicare Other | Attending: Orthopaedic Surgery | Admitting: Orthopaedic Surgery

## 2022-01-03 ENCOUNTER — Ambulatory Visit (HOSPITAL_COMMUNITY): Payer: Medicare Other

## 2022-01-03 ENCOUNTER — Encounter (HOSPITAL_BASED_OUTPATIENT_CLINIC_OR_DEPARTMENT_OTHER)
Admission: RE | Disposition: A | Discharge status: Home / Self Care | Payer: Self-pay | Source: Home / Self Care | Attending: Orthopaedic Surgery

## 2022-01-03 ENCOUNTER — Encounter (HOSPITAL_BASED_OUTPATIENT_CLINIC_OR_DEPARTMENT_OTHER): Payer: Self-pay | Admitting: Orthopaedic Surgery

## 2022-01-03 DIAGNOSIS — M19012 Primary osteoarthritis, left shoulder: Secondary | ICD-10-CM | POA: Diagnosis not present

## 2022-01-03 DIAGNOSIS — G4733 Obstructive sleep apnea (adult) (pediatric): Secondary | ICD-10-CM | POA: Diagnosis not present

## 2022-01-03 DIAGNOSIS — Z87891 Personal history of nicotine dependence: Secondary | ICD-10-CM | POA: Insufficient documentation

## 2022-01-03 DIAGNOSIS — I10 Essential (primary) hypertension: Secondary | ICD-10-CM | POA: Diagnosis not present

## 2022-01-03 DIAGNOSIS — Z791 Long term (current) use of non-steroidal anti-inflammatories (NSAID): Secondary | ICD-10-CM | POA: Diagnosis not present

## 2022-01-03 DIAGNOSIS — E785 Hyperlipidemia, unspecified: Secondary | ICD-10-CM | POA: Insufficient documentation

## 2022-01-03 DIAGNOSIS — K219 Gastro-esophageal reflux disease without esophagitis: Secondary | ICD-10-CM | POA: Insufficient documentation

## 2022-01-03 HISTORY — DX: Unspecified osteoarthritis, unspecified site: M19.90

## 2022-01-03 HISTORY — DX: Gastro-esophageal reflux disease without esophagitis: K21.9

## 2022-01-03 HISTORY — PX: REVERSE SHOULDER ARTHROPLASTY: SHX5054

## 2022-01-03 HISTORY — DX: Personal history of urinary calculi: Z87.442

## 2022-01-03 SURGERY — ARTHROPLASTY, SHOULDER, TOTAL, REVERSE
Anesthesia: General | Site: Shoulder | Laterality: Left

## 2022-01-03 MED ORDER — ONDANSETRON HCL 4 MG PO TABS: 4.0000 mg | ORAL_TABLET | Freq: Three times a day (TID) | ORAL | 0 refills | Status: AC | PRN

## 2022-01-03 MED ORDER — ACETAMINOPHEN 500 MG PO TABS
1000.0000 mg | ORAL_TABLET | Freq: Once | ORAL | Status: AC
  Administered 2022-01-03: 1000 mg via ORAL

## 2022-01-03 MED ORDER — PROPOFOL 10 MG/ML IV BOLUS
INTRAVENOUS | Status: AC
  Filled 2022-01-03: qty 20

## 2022-01-03 MED ORDER — 0.9 % SODIUM CHLORIDE (POUR BTL) OPTIME
TOPICAL | Status: DC | PRN
  Administered 2022-01-03: 1000 mL

## 2022-01-03 MED ORDER — VANCOMYCIN HCL 1000 MG IV SOLR
INTRAVENOUS | Status: DC | PRN
  Administered 2022-01-03: 1000 mg via TOPICAL

## 2022-01-03 MED ORDER — GABAPENTIN 100 MG PO CAPS
100.0000 mg | ORAL_CAPSULE | Freq: Three times a day (TID) | ORAL | 0 refills | Status: AC
Start: 2022-01-03 — End: 2022-01-17

## 2022-01-03 MED ORDER — GABAPENTIN 300 MG PO CAPS
ORAL_CAPSULE | ORAL | Status: AC
  Filled 2022-01-03: qty 1

## 2022-01-03 MED ORDER — PHENYLEPHRINE HCL (PRESSORS) 10 MG/ML IV SOLN
INTRAVENOUS | Status: DC | PRN
  Administered 2022-01-03: 80 ug via INTRAVENOUS
  Administered 2022-01-03: 40 ug via INTRAVENOUS
  Administered 2022-01-03 (×2): 80 ug via INTRAVENOUS
  Administered 2022-01-03: 120 ug via INTRAVENOUS

## 2022-01-03 MED ORDER — ACETAMINOPHEN 500 MG PO TABS
ORAL_TABLET | ORAL | Status: AC
  Filled 2022-01-03: qty 2

## 2022-01-03 MED ORDER — FENTANYL CITRATE (PF) 100 MCG/2ML IJ SOLN
100.0000 ug | Freq: Once | INTRAMUSCULAR | Status: AC
  Administered 2022-01-03: 50 ug via INTRAVENOUS

## 2022-01-03 MED ORDER — TRANEXAMIC ACID-NACL 1000-0.7 MG/100ML-% IV SOLN
1000.0000 mg | INTRAVENOUS | Status: AC
  Administered 2022-01-03: 1000 mg via INTRAVENOUS

## 2022-01-03 MED ORDER — MIDAZOLAM HCL 2 MG/2ML IJ SOLN: 2.0000 mg | Freq: Once | INTRAMUSCULAR | Status: DC

## 2022-01-03 MED ORDER — LIDOCAINE 2% (20 MG/ML) 5 ML SYRINGE
INTRAMUSCULAR | Status: AC
  Filled 2022-01-03: qty 5

## 2022-01-03 MED ORDER — OXYCODONE HCL 5 MG PO TABS: 5.0000 mg | ORAL_TABLET | Freq: Once | ORAL | Status: DC | PRN

## 2022-01-03 MED ORDER — LIDOCAINE 2% (20 MG/ML) 5 ML SYRINGE
INTRAMUSCULAR | Status: DC | PRN
  Administered 2022-01-03: 40 mg via INTRAVENOUS

## 2022-01-03 MED ORDER — LACTATED RINGERS IV SOLN: INTRAVENOUS | Status: DC

## 2022-01-03 MED ORDER — BUPIVACAINE LIPOSOME 1.3 % IJ SUSP
INTRAMUSCULAR | Status: DC | PRN
  Administered 2022-01-03: 10 mL via PERINEURAL

## 2022-01-03 MED ORDER — GABAPENTIN 300 MG PO CAPS
300.0000 mg | ORAL_CAPSULE | Freq: Once | ORAL | Status: AC
  Administered 2022-01-03: 300 mg via ORAL

## 2022-01-03 MED ORDER — LACTATED RINGERS IV BOLUS: 500.0000 mL | Freq: Once | INTRAVENOUS | Status: DC

## 2022-01-03 MED ORDER — PHENYLEPHRINE HCL (PRESSORS) 10 MG/ML IV SOLN
INTRAVENOUS | Status: AC
  Filled 2022-01-03: qty 1

## 2022-01-03 MED ORDER — EPHEDRINE 5 MG/ML INJ
INTRAVENOUS | Status: AC
  Filled 2022-01-03: qty 10

## 2022-01-03 MED ORDER — LACTATED RINGERS IV BOLUS: 250.0000 mL | Freq: Once | INTRAVENOUS | Status: DC

## 2022-01-03 MED ORDER — TRANEXAMIC ACID-NACL 1000-0.7 MG/100ML-% IV SOLN
INTRAVENOUS | Status: AC
  Filled 2022-01-03: qty 100

## 2022-01-03 MED ORDER — ONDANSETRON HCL 4 MG/2ML IJ SOLN: 4.0000 mg | Freq: Once | INTRAMUSCULAR | Status: DC | PRN

## 2022-01-03 MED ORDER — SUGAMMADEX SODIUM 200 MG/2ML IV SOLN
INTRAVENOUS | Status: DC | PRN
  Administered 2022-01-03: 200 mg via INTRAVENOUS

## 2022-01-03 MED ORDER — ONDANSETRON HCL 4 MG/2ML IJ SOLN
INTRAMUSCULAR | Status: DC | PRN
Start: 2022-01-03 — End: 2022-01-03
  Administered 2022-01-03: 4 mg via INTRAVENOUS

## 2022-01-03 MED ORDER — OXYCODONE HCL 5 MG/5ML PO SOLN: 5.0000 mg | Freq: Once | ORAL | Status: DC | PRN

## 2022-01-03 MED ORDER — MIDAZOLAM HCL 2 MG/2ML IJ SOLN
INTRAMUSCULAR | Status: AC
  Filled 2022-01-03: qty 2

## 2022-01-03 MED ORDER — ROCURONIUM BROMIDE 100 MG/10ML IV SOLN
INTRAVENOUS | Status: DC | PRN
Start: 2022-01-03 — End: 2022-01-03
  Administered 2022-01-03: 50 mg via INTRAVENOUS

## 2022-01-03 MED ORDER — BUPIVACAINE HCL (PF) 0.25 % IJ SOLN
INTRAMUSCULAR | Status: AC
  Filled 2022-01-03: qty 30

## 2022-01-03 MED ORDER — DEXAMETHASONE SODIUM PHOSPHATE 10 MG/ML IJ SOLN
INTRAMUSCULAR | Status: AC
  Filled 2022-01-03: qty 1

## 2022-01-03 MED ORDER — VANCOMYCIN HCL 1000 MG IV SOLR
INTRAVENOUS | Status: AC
  Filled 2022-01-03: qty 20

## 2022-01-03 MED ORDER — CELECOXIB 200 MG PO CAPS: 200.0000 mg | ORAL_CAPSULE | Freq: Two times a day (BID) | ORAL | 0 refills | Status: AC

## 2022-01-03 MED ORDER — FENTANYL CITRATE (PF) 100 MCG/2ML IJ SOLN: 25.0000 ug | INTRAMUSCULAR | Status: DC | PRN

## 2022-01-03 MED ORDER — ONDANSETRON HCL 4 MG/2ML IJ SOLN
INTRAMUSCULAR | Status: AC
  Filled 2022-01-03: qty 2

## 2022-01-03 MED ORDER — EPHEDRINE SULFATE (PRESSORS) 50 MG/ML IJ SOLN
INTRAMUSCULAR | Status: DC | PRN
  Administered 2022-01-03: 15 mg via INTRAVENOUS
  Administered 2022-01-03: 5 mg via INTRAVENOUS
  Administered 2022-01-03: 10 mg via INTRAVENOUS
  Administered 2022-01-03: 5 mg via INTRAVENOUS
  Administered 2022-01-03 (×3): 10 mg via INTRAVENOUS

## 2022-01-03 MED ORDER — FENTANYL CITRATE (PF) 100 MCG/2ML IJ SOLN
INTRAMUSCULAR | Status: DC | PRN
  Administered 2022-01-03 (×2): 50 ug via INTRAVENOUS

## 2022-01-03 MED ORDER — ROCURONIUM BROMIDE 10 MG/ML (PF) SYRINGE
PREFILLED_SYRINGE | INTRAVENOUS | Status: AC
  Filled 2022-01-03: qty 10

## 2022-01-03 MED ORDER — PHENYLEPHRINE HCL-NACL 20-0.9 MG/250ML-% IV SOLN
INTRAVENOUS | Status: DC | PRN
  Administered 2022-01-03: 25 ug/min via INTRAVENOUS

## 2022-01-03 MED ORDER — OMEPRAZOLE 20 MG PO CPDR: 20.0000 mg | DELAYED_RELEASE_CAPSULE | Freq: Every day | ORAL | 0 refills | Status: AC

## 2022-01-03 MED ORDER — CEFAZOLIN SODIUM-DEXTROSE 2-4 GM/100ML-% IV SOLN
2.0000 g | INTRAVENOUS | Status: AC
  Administered 2022-01-03: 2 g via INTRAVENOUS

## 2022-01-03 MED ORDER — PROPOFOL 10 MG/ML IV BOLUS
INTRAVENOUS | Status: DC | PRN
  Administered 2022-01-03: 140 mg via INTRAVENOUS

## 2022-01-03 MED ORDER — FENTANYL CITRATE (PF) 100 MCG/2ML IJ SOLN
INTRAMUSCULAR | Status: AC
  Filled 2022-01-03: qty 2

## 2022-01-03 MED ORDER — ACETAMINOPHEN 500 MG PO TABS: 1000.0000 mg | ORAL_TABLET | Freq: Three times a day (TID) | ORAL | 0 refills | Status: AC

## 2022-01-03 MED ORDER — ASPIRIN 81 MG PO CHEW: 81.0000 mg | CHEWABLE_TABLET | Freq: Two times a day (BID) | ORAL | 0 refills | Status: AC

## 2022-01-03 MED ORDER — DEXAMETHASONE SODIUM PHOSPHATE 10 MG/ML IJ SOLN
INTRAMUSCULAR | Status: DC | PRN
  Administered 2022-01-03: 10 mg via INTRAVENOUS

## 2022-01-03 MED ORDER — OXYCODONE HCL 5 MG PO TABS: ORAL_TABLET | ORAL | 0 refills | Status: AC

## 2022-01-03 MED ORDER — ATROPINE SULFATE 0.4 MG/ML IV SOLN
INTRAVENOUS | Status: AC
  Filled 2022-01-03: qty 1

## 2022-01-03 MED ORDER — CEFAZOLIN SODIUM-DEXTROSE 2-4 GM/100ML-% IV SOLN
INTRAVENOUS | Status: AC
  Filled 2022-01-03: qty 100

## 2022-01-03 MED ORDER — EPHEDRINE 5 MG/ML INJ
INTRAVENOUS | Status: AC
  Filled 2022-01-03: qty 5

## 2022-01-03 MED ORDER — BUPIVACAINE HCL (PF) 0.5 % IJ SOLN
INTRAMUSCULAR | Status: DC | PRN
  Administered 2022-01-03: 15 mL via PERINEURAL

## 2022-01-03 SURGICAL SUPPLY — 73 items
AID PSTN UNV HD RSTRNT DISP (MISCELLANEOUS) ×1
APL PRP STRL LF DISP 70% ISPRP (MISCELLANEOUS) ×2
BASEPLATE HALF WEDGE 35X29 (Plate) ×1 IMPLANT
BIT DRILL 3.2 PERIPHERAL SCREW (BIT) ×1 IMPLANT
BLADE HEX COATED 2.75 (ELECTRODE) ×1 IMPLANT
BLADE SAW SAG 73X25 THK (BLADE) ×1
BLADE SAW SGTL 73X25 THK (BLADE) ×2 IMPLANT
BLADE SURG 10 STRL SS (BLADE) IMPLANT
BLADE SURG 15 STRL LF DISP TIS (BLADE) IMPLANT
BLADE SURG 15 STRL SS (BLADE) ×2
BNDG COHESIVE 4X5 TAN ST LF (GAUZE/BANDAGES/DRESSINGS) IMPLANT
BRUSH SCRUB EZ PLAIN DRY (MISCELLANEOUS) ×3 IMPLANT
BSPLAT GLND 35D 29 HLF WDG (Plate) ×1 IMPLANT
CHLORAPREP W/TINT 26 (MISCELLANEOUS) ×4 IMPLANT
COOLER ICEMAN CLASSIC (MISCELLANEOUS) ×3 IMPLANT
COVER BACK TABLE 60X90IN (DRAPES) ×3 IMPLANT
COVER MAYO STAND STRL (DRAPES) ×3 IMPLANT
DRAPE IMP U-DRAPE 54X76 (DRAPES) ×3 IMPLANT
DRAPE INCISE IOBAN 66X45 STRL (DRAPES) ×3 IMPLANT
DRAPE U-SHAPE 76X120 STRL (DRAPES) ×6 IMPLANT
DRSG AQUACEL AG ADV 3.5X 6 (GAUZE/BANDAGES/DRESSINGS) ×3 IMPLANT
ELECT BLADE 4.0 EZ CLEAN MEGAD (MISCELLANEOUS) ×2
ELECT REM PT RETURN 9FT ADLT (ELECTROSURGICAL) ×2
ELECTRODE BLDE 4.0 EZ CLN MEGD (MISCELLANEOUS) ×2 IMPLANT
ELECTRODE REM PT RTRN 9FT ADLT (ELECTROSURGICAL) ×2 IMPLANT
FACESHIELD WRAPAROUND (MASK) ×4 IMPLANT
FACESHIELD WRAPAROUND OR TEAM (MASK) ×4 IMPLANT
GLENOSPHERE PERFORM 39 3 (Shoulder) ×2 IMPLANT
GLOVE SRG 8 PF TXTR STRL LF DI (GLOVE) ×2 IMPLANT
GLOVE SURG ENC MOIS LTX SZ6.5 (GLOVE) ×6 IMPLANT
GLOVE SURG LTX SZ8 (GLOVE) ×6 IMPLANT
GLOVE SURG UNDER POLY LF SZ6.5 (GLOVE) ×3 IMPLANT
GLOVE SURG UNDER POLY LF SZ8 (GLOVE) ×2
GOWN STRL REUS W/ TWL LRG LVL3 (GOWN DISPOSABLE) ×4 IMPLANT
GOWN STRL REUS W/TWL LRG LVL3 (GOWN DISPOSABLE) ×4
GOWN STRL REUS W/TWL XL LVL3 (GOWN DISPOSABLE) ×3 IMPLANT
GUIDE GLENOID PATIENT REVERSED (MISCELLANEOUS) ×1 IMPLANT
GUIDE PIN 3X75 SHOULDER (PIN) ×2
GUIDEWIRE GLENOID 2.5X220 (WIRE) ×1 IMPLANT
HANDPIECE INTERPULSE COAX TIP (DISPOSABLE) ×2
INSERT HUM PERFORM 3/4 39 +6 (Insert) ×1 IMPLANT
KIT SHOULDER STAB MARCO (KITS) ×3 IMPLANT
MANIFOLD NEPTUNE II (INSTRUMENTS) ×3 IMPLANT
PACK BASIN DAY SURGERY FS (CUSTOM PROCEDURE TRAY) ×3 IMPLANT
PACK SHOULDER (CUSTOM PROCEDURE TRAY) ×3 IMPLANT
PAD COLD SHLDR WRAP-ON (PAD) ×3 IMPLANT
PAD ORTHO SHOULDER 7X19 LRG (SOFTGOODS) ×3 IMPLANT
PENCIL SMOKE EVACUATOR (MISCELLANEOUS) IMPLANT
PIN GUIDE 3X75 SHOULDER (PIN) IMPLANT
RESTRAINT HEAD UNIVERSAL NS (MISCELLANEOUS) ×3 IMPLANT
SCREW 5.5X22 (Screw) ×2 IMPLANT
SCREW CENTRAL THREAD 6.5X45 (Screw) ×1 IMPLANT
SCREW PERIPHERAL 30 (Screw) ×2 IMPLANT
SET HNDPC FAN SPRY TIP SCT (DISPOSABLE) ×2 IMPLANT
SHEET MEDIUM DRAPE 40X70 STRL (DRAPES) ×3 IMPLANT
SLEEVE SCD COMPRESS KNEE MED (STOCKING) ×3 IMPLANT
SPHERE GLENOD +3X39XLATERALIZE (Shoulder) IMPLANT
SPHR GLND +3X39XLATERALIZE (Shoulder) ×1 IMPLANT
SPIKE FLUID TRANSFER (MISCELLANEOUS) IMPLANT
SPONGE T-LAP 18X18 ~~LOC~~+RFID (SPONGE) ×3 IMPLANT
STEM HUM PERFORM 3+ (Stem) ×1 IMPLANT
STRIP CLOSURE SKIN 1/2X4 (GAUZE/BANDAGES/DRESSINGS) ×3 IMPLANT
SUT ETHIBOND 2 V 37 (SUTURE) ×3 IMPLANT
SUT ETHIBOND NAB CT1 #1 30IN (SUTURE) ×3 IMPLANT
SUT FIBERWIRE #5 38 CONV NDL (SUTURE) ×8
SUT MNCRL AB 4-0 PS2 18 (SUTURE) ×3 IMPLANT
SUT VIC AB 0 CT1 27 (SUTURE)
SUT VIC AB 0 CT1 27XBRD ANBCTR (SUTURE) IMPLANT
SUT VIC AB 3-0 SH 27 (SUTURE) ×2
SUT VIC AB 3-0 SH 27X BRD (SUTURE) ×2 IMPLANT
SUTURE FIBERWR #5 38 CONV NDL (SUTURE) ×8 IMPLANT
TOWEL GREEN STERILE FF (TOWEL DISPOSABLE) ×9 IMPLANT
TUBE SUCTION HIGH CAP CLEAR NV (SUCTIONS) ×3 IMPLANT

## 2022-01-03 NOTE — Op Note (Signed)
Orthopaedic Surgery Operative Note (CSN: [id number]) ? ?Bradley Rose  [birth year] ?Date of Surgery: 01/03/2022 ? ? 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 ?Procedure: ?Left augmented lateralized reverse total Shoulder Arthroplasty ?  ?Operative Finding ?Successful completion of planned procedure.  Patient had greater than 30 degrees of glenoid retroversion and we will required custom 3D printing guides to obtain appropriate version correction.  The case went smoothly using these guides.  Tug test was normal.  We restored relatively normal alignment of the version.  Good stability with her implants that the glenoid bone quality was reasonable the humeral bone quality was relatively poor. ? ?Post-operative plan: The patient will be NWB in sling.  The patient will be will be discharged from PACU if continues to be stable as was plan prior to surgery.  DVT prophylaxis Aspirin 81 mg twice daily for 6 weeks.  Pain control with PRN pain medication preferring oral medicines.  Follow up plan will be scheduled in approximately 7 days for incision check and XR.  Physical therapy to start as soon as possible. ? ?Implants: Tornier perform humeral size 3+ stem, +6 polyethylene, 39+3 glenoid with a 20 9/2 wedge implant with a 45 center screw. ? ?Post-Op Diagnosis: Same ?Surgeons:Primary: Hiram Gash, MD ?Assistants:Caroline McBane PA-C ?Location: [facility] OR ROOM 6 ?Anesthesia: General with Exparel Interscalene ?Antibiotics: Ancef 2g preop, Vancomycin 1000mg  locally ?Tourniquet time: None ?Estimated Blood Loss: 100 ?Complications: None ?Specimens: None ?Implants: ?Implant Name Type Inv. Item Serial No. Manufacturer Lot No. LRB No. Used Action  ?BASEPLATE HALF WEDGE D34-534 - [id number] Plate BASEPLATE HALF WEDGE D34-534 [id number] [employer]  Left 1 Implanted  ?SCREW PERIPHERAL 30 - SON STERILE SET Screw SCREW PERIPHERAL 30 ON STERILE SET [employer]  Left 2 Implanted  ?SCREW 5.5X22 - SON STERILE SET Screw  SCREW 5.5X22 ON STERILE SET [employer]  Left 1 Implanted  ?GLENOSPHERE PERFORM 39 3 - [id number] Shoulder GLENOSPHERE PERFORM 39 3 [id number] [employer]  Left 1 Implanted  ?BASEPLATE HALF WEDGE D34-534 - [id number] Plate BASEPLATE HALF WEDGE D34-534 [id number] [employer]  Left 1 Implanted  ?STEM HUM PERFORM 3+ - [id number] Stem STEM HUM PERFORM 3+ [id number] [employer]  Left 1 Implanted  ?INSERT HUM PERFORM 3/4 39 +6 - [id number] Insert INSERT HUM PERFORM 3/4 39 +6 [id number] [employer]  Left 1 Implanted  ? ? ?Indications for Surgery:   ?Bradley Rose is a 76 y.o. 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  Benefits and risks of operative and nonoperative management were discussed prior to surgery with patient/guardian(s) and informed consent form was completed.  Infection and need for further surgery were discussed as was prosthetic stability and cuff issues.  We additionally specifically discussed risks of axillary nerve injury, infection, periprosthetic fracture, continued pain and longevity of implants prior to beginning procedure.   ? ? ? ?Procedure:   ?The patient was identified in the preoperative holding area where the surgical site was marked. Block placed by anesthesia with exparel.  The patient was taken to the OR where a procedural timeout was called and the above noted anesthesia was induced.  The patient was positioned beachchair on allen table with spider arm positioner.  Preoperative antibiotics were dosed.  The patient's left shoulder was prepped and draped in the usual sterile fashion.  A second preoperative timeout was called.     ? ? ?Standard deltopectoral approach was performed with a #10 blade. We dissected down to the subcutaneous  tissues and the cephalic vein was taken laterally with the deltoid. Clavipectoral fascia was incised in line with the incision. Deep retractors were placed. The long of the biceps tendon was identified and there was significant  tenosynovitis present.  Tenodesis was performed to the pectoralis tendon with #2 Ethibond. The remaining biceps was followed up into the rotator interval where it was released.  ? ?The subscapularis was taken down in a full thickness layer with capsule along the humeral neck extending inferiorly around the humeral head. We continued releasing the capsule directly off of the osteophytes inferiorly all the way around the corner. This allowed [place] to dislocate the humeral head.  ? ?The humeral head had evidence of severe osteoarthritic wear with full-thickness cartilage loss and exposed subchondral bone. There was significant flattening of the humeral head.  ? ?The rotator cuff was carefully examined and noted to be irreperably torn.  The decision was confirmed that a reverse total shoulder was indicated for this patient. ? ?There were osteophytes along the inferior humeral neck. The osteophytes were removed with an osteotome and a rongeur.  Osteophytes were removed with a rongeur and an osteotome and the anatomic neck was well visualized.    ? ?A humeral cutting guide was used extra medullary with a pin to help control version. The version was set at 20? of retroversion. Humeral osteotomy was performed with an oscillating saw. The head fragment was passed off the back table.  A cut protector plate was placed. ? ?The subscapularis was again identified and immediately we took care to palpate the axillary nerve anteriorly and verify its position with gentle palpation as well as the tug test.  We then released the SGHL with bovie cautery prior to placing a curved mayo at the junction of the anterior glenoid well above the axillary nerve and bluntly dissecting the subscapularis from the capsule.  We then carefully protected the axillary nerve as we gently released the inferior capsule to fully mobilize the subscapularis.  An anterior deltoid retractor was then placed as well as a small Hohmann retractor superiorly. ?  ?The  glenoid was inspected and had evidence of severe osteoarthritic wear with full-thickness cartilage loss and exposed subchondral bone.  ? ? ?The remaining labrum was removed circumferentially taking great care not to disrupt the posterior capsule.  ? ? ?Based on the patient's previous imaging we felt that blueprint planning was appropriate.  He had greater than 30 degrees of retroversion we felt that with the significant erosion that a custom guide would be appropriate.  We used a 3D custom guide to place a center guidepin in the predetermined location.  We checked our position before proceeding to ream the anterior glenoid with a flat reamer.  We then used a augment reamer to ream the posterior aspect of the glenoid taking very little bone.  We had good fit with our trial.  At that point we reamed the vault which was intact. ? ?Next the glenoid vault was drilled back to a depth of 45 mm.  We tapped and then placed a 77mm size baseplate with additional 76mm lateralization was selected with a 6.5 mm x 45 mm length central screw.  The base plate was screwed into the glenoid vault obtaining secure fixation. We next placed superior and inferior locking screws for additional fixation.  Next a 39 +3 mm glenosphere was selected and impacted onto the baseplate. The center screw was tightened. ? ?We turned attention back to the humeral side. The cut  protector was removed.  We used the perform humeral sizing block to select the appropriate size which for this patient was a 3.  We then placed our center pin and reamed over it concentrically obtaining appropriate inset.  We then used our lateralizing chisel to prepare the lateral aspect of the humerus.  At that point we selected the appropriate implant trialing a 3+.  Using this trial implant we trialed multiple polyethylene sizes settling on a 6 which provided good stability and range of motion without excess soft tissue tension. The offset was dialed in to match the normal  anatomy. The shoulder was trialed.  There was good ROM in all planes and the shoulder was stable with no inferior translation. ? ?The real humeral implants were opened after again confirming sizes.  The trial was removed

## 2022-01-03 NOTE — Interval H&P Note (Signed)
All questions answered, patient wants to proceed with procedure. ? ?

## 2022-01-03 NOTE — Transfer of Care (Signed)
Immediate Anesthesia Transfer of Care Note ? ?Patient: Bradley Rose ? ?Procedure(s) Performed: REVERSE SHOULDER ARTHROPLASTY (Left: Shoulder) ? ?Patient Location: PACU ? ?Anesthesia Type:General and Regional ? ?Level of Consciousness: awake, alert  and oriented ? ?Airway & Oxygen Therapy: Patient Spontanous Breathing and Patient connected to face mask oxygen ? ?Post-op Assessment: Report given to RN and Post -op Vital signs reviewed and stable ? ?Post vital signs: Reviewed and stable ? ?Last Vitals:  ?Vitals Value Taken Time  ?BP 124/66 01/03/22 0910  ?Temp    ?Pulse 96 01/03/22 0912  ?Resp 18 01/03/22 0912  ?SpO2 99 % 01/03/22 0912  ?Vitals shown include unvalidated device data. ? ?Last Pain:  ?Vitals:  ? 01/03/22 0647  ?TempSrc: Oral  ?PainSc: 5   ?   ? ?Patients Stated Pain Goal: 5 (01/03/22 8916) ? ?Complications: No notable events documented. ?

## 2022-01-03 NOTE — Anesthesia Preprocedure Evaluation (Addendum)
Anesthesia Evaluation  ?Patient identified by MRN, date of birth, ID band ?Patient awake ? ? ? ?Reviewed: ?Allergy & Precautions, NPO status , Patient's Chart, lab work & pertinent test results ? ?History of Anesthesia Complications ?Negative for: history of anesthetic complications ? ?Airway ?Mallampati: I ? ?TM Distance: >3 FB ?Neck ROM: Full ? ? ? Dental ? ?(+) Dental Advisory Given, Teeth Intact ?  ?Pulmonary ?sleep apnea and Continuous Positive Airway Pressure Ventilation , former smoker,  ?  ?Pulmonary exam normal ? ? ? ? ? ? ? Cardiovascular ?hypertension, Pt. on medications ?Normal cardiovascular exam ? ? ?  ?Neuro/Psych ?negative neurological ROS ? negative psych ROS  ? GI/Hepatic ?Neg liver ROS, GERD  Controlled,  ?Endo/Other  ?negative endocrine ROS ? Renal/GU ?negative Renal ROS  ? ?  ?Musculoskeletal ? ?(+) Arthritis ,  ? Abdominal ?  ?Peds ? Hematology ?negative hematology ROS ?(+)   ?Anesthesia Other Findings ? ? Reproductive/Obstetrics ? ?  ? ? ? ? ? ? ? ? ? ? ? ? ? ?  ?  ? ? ? ? ? ? ? ?Anesthesia Physical ?Anesthesia Plan ? ?ASA: 2 ? ?Anesthesia Plan: General  ? ?Post-op Pain Management: Regional block*  ? ?Induction: Intravenous ? ?PONV Risk Score and Plan: 2 and Treatment may vary due to age or medical condition, Ondansetron and Dexamethasone ? ?Airway Management Planned: Oral ETT ? ?Additional Equipment: None ? ?Intra-op Plan:  ? ?Post-operative Plan: Extubation in OR ? ?Informed Consent: I have reviewed the patients History and Physical, chart, labs and discussed the procedure including the risks, benefits and alternatives for the proposed anesthesia with the patient or authorized representative who has indicated his/her understanding and acceptance.  ? ? ? ?Dental advisory given ? ?Plan Discussed with: CRNA and Anesthesiologist ? ?Anesthesia Plan Comments:   ? ? ? ? ? ?Anesthesia Quick Evaluation ? ?

## 2022-01-03 NOTE — Anesthesia Postprocedure Evaluation (Signed)
Anesthesia Post Note ? ?Patient: RAJAN LESER ? ?Procedure(s) Performed: REVERSE SHOULDER ARTHROPLASTY (Left: Shoulder) ? ?  ? ?Patient location during evaluation: PACU ?Anesthesia Type: General ?Level of consciousness: awake and alert ?Pain management: pain level controlled ?Vital Signs Assessment: post-procedure vital signs reviewed and stable ?Respiratory status: spontaneous breathing, nonlabored ventilation and respiratory function stable ?Cardiovascular status: stable and blood pressure returned to baseline ?Anesthetic complications: no ? ? ?No notable events documented. ? ?Last Vitals:  ?Vitals:  ? 01/03/22 0955 01/03/22 1013  ?BP:  107/60  ?Pulse: 96 95  ?Resp: 17 16  ?Temp:  36.6 ?C  ?SpO2: 91% 94%  ?  ?Last Pain:  ?Vitals:  ? 01/03/22 1013  ?TempSrc:   ?PainSc: 0-No pain  ? ? ?  ?  ?  ?  ?  ?  ? ?Audry Pili ? ? ? ? ?

## 2022-01-03 NOTE — Anesthesia Procedure Notes (Signed)
Anesthesia Regional Block: Interscalene brachial plexus block  ? ?Pre-Anesthetic Checklist: , timeout performed,  Correct Patient, Correct Site, Correct Laterality,  Correct Procedure, Correct Position, site marked,  Risks and benefits discussed,  Surgical consent,  Pre-op evaluation,  At surgeon's request and post-op pain management ? ?Laterality: Left ? ?Prep: chloraprep     ?  ?Needles:  ?Injection technique: Single-shot ? ?Needle Type: Echogenic Needle   ? ? ?Needle Length: 5cm  ?Needle Gauge: 21  ? ? ? ?Additional Needles: ? ? ?Narrative:  ?Start time: 01/03/2022 7:05 AM ?End time: 01/03/2022 7:08 AM ?Injection made incrementally with aspirations every 5 mL. ? ?Performed by: Personally  ?Anesthesiologist: Audry Pili, MD ? ?Additional Notes: ?No pain on injection. No increased resistance to injection. Injection made in 5cc increments. Good needle visualization. Patient tolerated the procedure well. ? ? ? ? ?

## 2022-01-03 NOTE — Anesthesia Procedure Notes (Signed)
Procedure Name: Intubation ?Date/Time: 01/03/2022 7:32 AM ?Performed by: Lavonia Dana, CRNA ?Pre-anesthesia Checklist: Patient identified, Emergency Drugs available, Suction available and Patient being monitored ?Patient Re-evaluated:Patient Re-evaluated prior to induction ?Oxygen Delivery Method: Circle system utilized ?Preoxygenation: Pre-oxygenation with 100% oxygen ?Induction Type: IV induction ?Ventilation: Mask ventilation without difficulty and Oral airway inserted - appropriate to patient size ?Laryngoscope Size: Mac and 4 ?Grade View: Grade I ?Tube type: Oral ?Tube size: 7.5 mm ?Number of attempts: 1 ?Airway Equipment and Method: Stylet, Oral airway and Bite block ?Placement Confirmation: ETT inserted through vocal cords under direct vision, positive ETCO2 and breath sounds checked- equal and bilateral ?Secured at: 22 cm ?Tube secured with: Tape ?Dental Injury: Teeth and Oropharynx as per pre-operative assessment  ? ? ? ? ?

## 2022-01-03 NOTE — Progress Notes (Signed)
Assisted Dr. Brock with left, interscalene , ultrasound guided block. Side rails up, monitors on throughout procedure. See vital signs in flow sheet. Tolerated Procedure well. 

## 2022-01-04 ENCOUNTER — Encounter (HOSPITAL_BASED_OUTPATIENT_CLINIC_OR_DEPARTMENT_OTHER): Payer: Self-pay | Admitting: Orthopaedic Surgery

## 2022-02-20 ENCOUNTER — Telehealth: Payer: Self-pay | Admitting: Adult Health

## 2022-02-21 NOTE — Telephone Encounter (Signed)
I've sent a high importance message to [facility] through epic to get a update and to let them know what the patient is saying.

## 2022-02-21 NOTE — Telephone Encounter (Signed)
Order was placed 12/25/21 after pt's OV with TP. PCCs, please advise on this for pt.

## 2022-02-25 ENCOUNTER — Ambulatory Visit: Payer: Medicare Other | Admitting: Adult Health

## 2022-02-25 NOTE — Telephone Encounter (Signed)
Huffstetler, Silvestre Gunner D Printed  Thank you   Previous Messages   ----- Message -----  From: Thomes Cake D  Sent: 02/21/2022  10:56 AM EDT  To: Royce Macadamia, Berneice Gandy, *  Subject: CPAP order                                     DOB:April 16, 1946   Patient states has not received CPAP machine. [facility] states does not have order. Patient phone number is [phone].   Order was put in by Tammy Parrett   Please advise   Marchelle Folks

## 2022-03-22 ENCOUNTER — Telehealth: Payer: Self-pay | Admitting: Adult Health

## 2022-03-25 NOTE — Telephone Encounter (Signed)
Called the number provided and was unable to speak with Amber.   Called the local [facility] and spoke with Terri. She stated that we could fax the order to the local office.   PCCs, can we fax the order from March 2023 to [facility]? Thanks!

## 2022-03-25 NOTE — Telephone Encounter (Signed)
I faxed the order to [facility] and received a confirmation fax back.

## 2022-03-28 NOTE — Telephone Encounter (Signed)
Nothing further needed at this time. 

## 2023-01-28 ENCOUNTER — Encounter (HOSPITAL_BASED_OUTPATIENT_CLINIC_OR_DEPARTMENT_OTHER): Payer: Self-pay | Admitting: Pulmonary Disease

## 2023-01-28 ENCOUNTER — Ambulatory Visit (INDEPENDENT_AMBULATORY_CARE_PROVIDER_SITE_OTHER): Payer: Medicare Other | Admitting: Pulmonary Disease

## 2023-01-28 VITALS — BP 132/78 | HR 61 | Temp 98.1°F | Ht 67.0 in | Wt 186.0 lb

## 2023-01-28 DIAGNOSIS — G4733 Obstructive sleep apnea (adult) (pediatric): Secondary | ICD-10-CM | POA: Diagnosis not present

## 2023-01-28 NOTE — Patient Instructions (Signed)
Will have [facility] change your auto CPAP to 5 -11 cm water pressure  Follow up in 1 year

## 2023-01-28 NOTE — Progress Notes (Signed)
[facility]  Chief Complaint  Patient presents with   Follow-up    Follow up.     Past Surgical History:  He  has a past surgical history that includes Lithotripsy; Wrist fracture surgery (Left); Patella fracture surgery (Right); Colonoscopy; and Reverse shoulder arthroplasty (Left, 01/03/2022).  Past Medical History:  HTN, HLD, Arthritis, GERD, Nephrolithiasis, BPH  Constitutional:  BP 132/78 (BP Location: Left Arm, Patient Position: Sitting, Cuff Size: Normal)   Pulse 61   Temp 98.1 F (36.7 C) (Oral)   Ht  (1.702 m)   Wt 186 lb (84.4 kg)   SpO2 98%   BMI 29.13 kg/m   Brief Summary:  Bradley Rose is a 77 y.o. male former smoker with obstructive sleep apnea.       Subjective:   He got a new machine last year.  He gets mask leak that wakes him up when his pressure setting gets too high.  He turns his machine of briefly and then restarts it.  This fixes the problem but disrupts his sleep.  Not having sinus congestion.  Gets more dry mouth when his mask leaks.  Feels rested during the day.  He had shoulder replacement since last year.  No longer needing pain medication and sleeping better since he is no longer in pain.  Physical Exam:   Appearance - well kempt   ENMT - no sinus tenderness, no oral exudate, no LAN, Mallampati 3 airway, no stridor  Respiratory - equal breath sounds bilaterally, no wheezing or rales  CV - s1s2 regular rate and rhythm, no murmurs  Ext - no clubbing, no edema  Skin - no rashes  Psych - normal mood and affect   Sleep Tests:  HST 08/08/14 >> AHI 49.9, SaO2 low 81% Auto CPAP 10/30/22 to 01/27/23 >> used on 90 of 90 nights with average 8 hrs 13 min.  Average AHI 5.5 with median CPAP 10 and 95 th percentile CPAP 15 cm H2O  Social History:  He  reports that he quit smoking about 29 years ago. His smoking use included cigarettes. He has a 12.50 pack-year smoking history. He has never used  smokeless tobacco. He reports current alcohol use. He reports that he does not use drugs.  Family History:  His family history includes Hypertension in his father; Macular degeneration in his mother; Sleep apnea in his brother.     Assessment/Plan:   Obstructive sleep apnea. - he is compliant with CPAP and reports benefit from therapy - he uses [facility] for his DME - current CPAP ordered March 2023 - he is getting mask leak when auto settings go to high - will change him to auto CPAP 5 -11 cm H2O  Time Spent Involved in Patient Care on Day of Examination:  25 minutes  Follow up:   Patient Instructions  Will have [facility] change your auto CPAP to 5 -11 cm water pressure  Follow up in 1 year  Medication List:   Allergies as of 01/28/2023   No Known Allergies      Medication List        Accurate as of January 28, 2023  9:09 AM. If you have any questions, ask your nurse or doctor.          allopurinol 300 MG tablet Commonly known as: ZYLOPRIM Take 300 mg by mouth daily.   cyanocobalamin 1000 MCG tablet Commonly known as: VITAMIN B12 Take 1,000 mcg by mouth daily.  Fish Oil 1000 MG Caps Take 1,000 mg by mouth daily.   tamsulosin 0.4 MG Caps capsule Commonly known as: FLOMAX Take 0.4 mg by mouth daily after breakfast.   valsartan-hydrochlorothiazide 160-25 MG tablet Commonly known as: DIOVAN-HCT Take 1 tablet by mouth daily.   Vitamin D (Ergocalciferol) 1.25 MG (50000 UNIT) Caps capsule Commonly known as: DRISDOL Take 50,000 Units by mouth every 21 ( twenty-one) days. Last dose approx 12/27/14        Signature:  Coralyn Helling, MD [facility] Pager - [phone] 01/28/2023, 9:09 AM

## 2023-05-13 ENCOUNTER — Other Ambulatory Visit: Payer: Self-pay | Admitting: Orthopaedic Surgery

## 2023-05-13 DIAGNOSIS — Z01818 Encounter for other preprocedural examination: Secondary | ICD-10-CM

## 2023-05-19 ENCOUNTER — Ambulatory Visit
Admission: RE | Admit: 2023-05-19 | Discharge: 2023-05-19 | Disposition: A | Discharge status: Home / Self Care | Payer: Medicare Other | Source: Ambulatory Visit | Attending: Orthopaedic Surgery | Admitting: Orthopaedic Surgery

## 2023-05-19 DIAGNOSIS — Z01818 Encounter for other preprocedural examination: Secondary | ICD-10-CM

## 2023-06-13 ENCOUNTER — Telehealth: Payer: Self-pay | Admitting: Pulmonary Disease

## 2023-06-13 NOTE — Telephone Encounter (Signed)
They did a surgical CT and see some nod in this PT . They want to know if you would like to do a chest CT on him. The radiologist recom the chest CT. Do you want to take over and order it.   [facility]  Phone is [phone]  Sorry- This was for Dr. Craige Cotta.

## 2023-06-17 NOTE — Telephone Encounter (Signed)
I have only seen him for sleep apnea before.  This is a new problem.  He needs to be scheduled for an ROV to discuss in more detail.  Will need appointment with Dr. Delton Coombes or Dr. Tonia Brooms.

## 2023-06-18 NOTE — Telephone Encounter (Signed)
Spoke with Lavonna Rua on the clinical staff (McDain is in the OR today) in regards to patient. She was wanting to schedule an appointment for the patient. I gave her [address] number for her to call and make appt.

## 2023-07-02 ENCOUNTER — Ambulatory Visit: Payer: Medicare Other | Admitting: Pulmonary Disease

## 2023-07-23 ENCOUNTER — Ambulatory Visit (INDEPENDENT_AMBULATORY_CARE_PROVIDER_SITE_OTHER): Payer: Medicare Other | Admitting: Emergency Medicine

## 2023-07-23 ENCOUNTER — Encounter: Payer: Self-pay | Admitting: Emergency Medicine

## 2023-07-23 VITALS — BP 132/78 | HR 77 | Temp 98.1°F | Ht 67.0 in | Wt 184.2 lb

## 2023-07-23 DIAGNOSIS — G4733 Obstructive sleep apnea (adult) (pediatric): Secondary | ICD-10-CM | POA: Diagnosis not present

## 2023-07-23 DIAGNOSIS — R918 Other nonspecific abnormal finding of lung field: Secondary | ICD-10-CM | POA: Insufficient documentation

## 2023-07-23 NOTE — Assessment & Plan Note (Signed)
Noted on his right shoulder CT scan.  We talked about the differential diagnosis.  He needs a dedicated CT chest and we will arrange  Pulmonary Nodules Incidental finding of three right apical pulmonary nodules and some mosaic ground glass attenuation on a CT scan of the right shoulder. Discussed the potential causes of pulmonary nodules, including inflammatory diseases, infections, and malignancy. No symptoms or risk factors suggestive of malignancy. -Order a dedicated CT scan of the chest to fully evaluate the nodules.

## 2023-07-23 NOTE — Patient Instructions (Signed)
Dear Bradley Rose, during your recent visit, we discussed two main health concerns: the incidental finding of three right apical pulmonary nodules and your well-managed obstructive sleep apnea. The nodules were discovered during a CT scan of your right shoulder. We also discussed your excellent compliance with your CPAP machine for sleep apnea.  YOUR PLAN:  -PULMONARY NODULES: Pulmonary nodules are small round or oval-shaped growths in the lung. They were found incidentally on your CT scan. While they can be caused by various conditions including inflammation, infection, or cancer, you don't have any symptoms or risk factors suggesting cancer. To fully evaluate these nodules, we will order a dedicated CT scan of your chest.  -OBSTRUCTIVE SLEEP APNEA: Obstructive sleep apnea is a condition where your breathing repeatedly stops and starts during sleep. You have been managing this well with your CPAP machine, showing 100% compliance. You should continue with your current CPAP settings and maintenance regimen, and follow up with Dr. Craige Cotta at [facility] for ongoing management.  INSTRUCTIONS:  Please schedule a dedicated CT scan of your chest to further evaluate the pulmonary nodules. Continue using your CPAP machine as directed and maintain your follow-up appointments with Dr. Craige Cotta at [facility] for your sleep apnea management.

## 2023-07-23 NOTE — Progress Notes (Signed)
Subjective:    Patient ID: Bradley Rose, male    DOB: 26-Jul-1946, 77 y.o.   MRN: [id number]  HPI [name], a 77 year old former smoker with a 12.5 pack-year history, has been managed for obstructive sleep apnea with CPAP auto set between 5 and 11 cm water. The patient has shown excellent compliance with CPAP usage, with 100% usage for greater than four hours recorded between 06/22/23 and 07/21/23. The obstructive events are well-controlled with minimal leak.  The patient presents today for evaluation of possible pulmonary nodules. These were incidentally discovered on a CT scan of the right shoulder, performed on 05/19/23 in preparation for potential shoulder surgery. The scan revealed three right apical pulmonary nodules and some mosaic ground glass attenuation.  The patient has a history of bronchial infections and one episode of pleurisy during adolescence. He denies any known exposure to asbestos or tuberculosis. The patient has lived locally in [place] for his entire life, with a brief period spent in [place] during [employer]. 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 The patient quit smoking 29 years ago and has not had any pets, specifically birds or chickens.  The patient has been using a CPAP machine for his obstructive sleep apnea and reports good compliance with the device. He has not been on any inhaler medications. The patient reports no current respiratory symptoms or ankle swelling.   Review of Systems As per HPI  Past Medical History:  Diagnosis Date   Arthritis    DJD left shoulder   GERD (gastroesophageal reflux disease)    OTC prn   History of kidney stones    HTN (hypertension)    Hyperlipidemia    OSA (obstructive sleep apnea) 06/09/2014   uses CPAP nightly     Family History  Problem Relation Age of Onset   Hypertension Father    Macular degeneration Mother    Sleep apnea Brother       Social History   Socioeconomic History   Marital status: Married    Spouse name: Not on file   Number of children: Not on file   Years of education: Not on file   Highest education level: Not on file  Occupational History   Occupation: Retired    Associate Professor: [employer]  Tobacco Use   Smoking status: Former    Current packs/day: 0.00    Average packs/day: 0.5 packs/day for 25.0 years (12.5 ttl pk-yrs)    Types: Cigarettes    Start date: 10/07/1968    Quit date: 10/07/1993    Years since quitting: 29.8   Smokeless tobacco: Never  Vaping Use   Vaping status: Never Used  Substance and Sexual Activity   Alcohol use: Yes    Comment: social, sometimes 1-2 beers/d   Drug use: No   Sexual activity: Not on file  Other Topics Concern   Not on file  Social History Narrative   Not on file   Social Determinants of Health   Financial Resource Strain: Not on file  Food Insecurity: Not on file  Transportation Needs: Not on file  Physical Activity: Not on file  Stress: Not on file  Social Connections: Not on file  Intimate Partner Violence: Not on file   - Former smoker with 12.5 pack years - Worked in Herbalist, Personnel officer station, and Research officer, political party in the [employer] -From [employer] [place]   No Known Allergies   Outpatient Medications Prior to Visit  Medication Sig Dispense Refill   allopurinol (ZYLOPRIM) 300 MG tablet Take 300 mg by mouth daily.     Omega-3 Fatty Acids (FISH OIL) 1000 MG CAPS Take 1,000 mg by mouth daily.      tamsulosin (FLOMAX) 0.4 MG CAPS capsule Take 0.4 mg by mouth daily after breakfast.     valsartan-hydrochlorothiazide (DIOVAN-HCT) 160-25 MG per tablet Take 1 tablet by mouth daily.     vitamin B-12 (CYANOCOBALAMIN) 1000 MCG tablet Take 1,000 mcg by mouth daily.     Vitamin D, Ergocalciferol, (DRISDOL) 50000 UNITS CAPS capsule Take 50,000 Units by mouth every 21 ( twenty-one) days. Last dose approx 12/27/14     No facility-administered  medications prior to visit.        Objective:   Physical Exam  Vitals:   07/23/23 1305  BP: 132/78  Pulse: 77  Temp: 98.1 F (36.7 C)  TempSrc: Oral  SpO2: 96%  Weight: 184 lb 3.2 oz (83.6 kg)  Height: 5\' 7"  (1.702 m)    Gen: Pleasant, well-nourished, in no distress,  normal affect  ENT: No lesions,  mouth clear,  oropharynx clear, no postnasal drip  Neck: No JVD, no stridor  Lungs: No use of accessory muscles, no crackles or wheezing on normal respiration, no wheeze on forced expiration  Cardiovascular: RRR, heart sounds normal, no murmur or gallops, no peripheral edema  Musculoskeletal: No deformities, no cyanosis or clubbing  Neuro: alert, awake, non focal  Skin: Warm, no lesions or rash     Assessment & Plan:  Pulmonary nodules Noted on his right shoulder CT scan.  We talked about the differential diagnosis.  He needs a dedicated CT chest and we will arrange  Pulmonary Nodules Incidental finding of three right apical pulmonary nodules and some mosaic ground glass attenuation on a CT scan of the right shoulder. Discussed the potential causes of pulmonary nodules, including inflammatory diseases, infections, and malignancy. No symptoms or risk factors suggestive of malignancy. -Order a dedicated CT scan of the chest to fully evaluate the nodules.   OSA (obstructive sleep apnea) Obstructive Sleep Apnea Well controlled on CPAP with auto set between 5 and 11 cm water. 100% compliance for greater than 4 hours between 06/22/2023 and 07/21/2023. No issues with equipment or maintenance. -Continue current CPAP settings and maintenance regimen. -Follow-up with Dr. Craige Cotta at [facility] for ongoing management.   Levy Pupa, MD, PhD 07/23/2023, 1:27 PM [facility] [phone] or if no answer before 7:00PM call [phone] For any issues after 7:00PM please call eLink [phone]

## 2023-07-23 NOTE — Assessment & Plan Note (Signed)
Obstructive Sleep Apnea Well controlled on CPAP with auto set between 5 and 11 cm water. 100% compliance for greater than 4 hours between 06/22/2023 and 07/21/2023. No issues with equipment or maintenance. -Continue current CPAP settings and maintenance regimen. -Follow-up with Dr. Craige Cotta at [facility] for ongoing management.

## 2023-08-05 ENCOUNTER — Ambulatory Visit
Admission: RE | Admit: 2023-08-05 | Discharge: 2023-08-05 | Disposition: A | Discharge status: Home / Self Care | Payer: Medicare Other | Source: Ambulatory Visit | Attending: Emergency Medicine | Admitting: Emergency Medicine

## 2023-08-05 DIAGNOSIS — R918 Other nonspecific abnormal finding of lung field: Secondary | ICD-10-CM

## 2023-08-07 ENCOUNTER — Telehealth: Payer: Self-pay | Admitting: Emergency Medicine

## 2023-08-07 NOTE — Telephone Encounter (Signed)
DRI dropped off disc that are inside of an envelope. Placed in [facility]'s box.

## 2023-08-09 NOTE — Telephone Encounter (Signed)
Routing to Dr. Lamonte Sakai as an Juluis Rainier

## 2023-08-11 NOTE — Telephone Encounter (Signed)
Thank you :)

## 2023-08-21 ENCOUNTER — Encounter: Payer: Self-pay | Admitting: Emergency Medicine

## 2023-08-21 ENCOUNTER — Ambulatory Visit: Payer: Medicare Other | Admitting: Emergency Medicine

## 2023-08-21 VITALS — BP 128/62 | HR 73 | Temp 98.1°F | Ht 67.0 in | Wt 185.4 lb

## 2023-08-21 DIAGNOSIS — R918 Other nonspecific abnormal finding of lung field: Secondary | ICD-10-CM

## 2023-08-21 DIAGNOSIS — G4733 Obstructive sleep apnea (adult) (pediatric): Secondary | ICD-10-CM

## 2023-08-21 NOTE — Progress Notes (Addendum)
Subjective:    Patient ID: Bradley Rose, male    DOB: [birth year], 77 y.o.   MRN: [id number]  HPI Bradley Rose, a 77 year old former smoker with a 12.5 pack-year history, has been managed for obstructive sleep apnea with CPAP auto set between 5 and 11 cm water. The patient has shown excellent compliance with CPAP usage, with 100% usage for greater than four hours recorded between 06/22/23 and 07/21/23. The obstructive events are well-controlled with minimal leak.  The patient presents today for evaluation of possible pulmonary nodules. These were incidentally discovered on a CT scan of the right shoulder, performed on 05/19/23 in preparation for potential shoulder surgery. The scan revealed three right apical pulmonary nodules and some mosaic ground glass attenuation.  The patient has a history of bronchial infections and one episode of pleurisy during adolescence. He denies any known exposure to asbestos or tuberculosis. The patient has lived locally in [place] for his entire life, with a brief period spent in [place] during [employer]. 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 The patient quit smoking 29 years ago and has not had any pets, specifically birds or chickens.  The patient has been using a CPAP machine for his obstructive sleep apnea and reports good compliance with the device. He has not been on any inhaler medications. The patient reports no current respiratory symptoms or ankle swelling.  ROV 08/21/2023 --follow-up visit 77 year old man with history of tobacco, OSA on CPAP and pulmonary nodules noted on CT scan of the shoulder from August 2024.  There were 3 right apical nodules and some mosaic groundglass attenuation noted.  We ordered a dedicated CT chest as below  CT chest 08/05/2023 reviewed by me, shows scattered subpleural bilateral pulmonary nodules up to 5 mm in the minor fissure with some bibasilar scar that appear  inflammatory   Review of Systems As per HPI  Past Medical History:  Diagnosis Date   Arthritis    DJD left shoulder   GERD (gastroesophageal reflux disease)    OTC prn   History of kidney stones    HTN (hypertension)    Hyperlipidemia    OSA (obstructive sleep apnea) 06/09/2014   uses CPAP nightly     Family History  Problem Relation Age of Onset   Hypertension Father    Macular degeneration Mother    Sleep apnea Brother      Social History   Socioeconomic History   Marital status: Married    Spouse name: Not on file   Number of children: Not on file   Years of education: Not on file   Highest education level: Not on file  Occupational History   Occupation: Retired    Associate Professor: [employer]  Tobacco Use   Smoking status: Former    Current packs/day: 0.00    Average packs/day: 0.5 packs/day for 25.0 years (12.5 ttl pk-yrs)    Types: Cigarettes    Start date: 10/07/1968    Quit date: 10/07/1993    Years since quitting: 29.8   Smokeless tobacco: Never  Vaping Use   Vaping status: Never Used  Substance and Sexual Activity   Alcohol use: Yes    Comment: social, sometimes 1-2 beers/d   Drug use: No   Sexual activity: Not on file  Other Topics Concern   Not on file  Social History Narrative   Not on file   Social Determinants of Health   Financial Resource Strain: Not  on file  Food Insecurity: Not on file  Transportation Needs: Not on file  Physical Activity: Not on file  Stress: Not on file  Social Connections: Not on file  Intimate Partner Violence: Not on file   - Former smoker with 12.5 pack years - Worked in Herbalist, service station, and Research officer, political party in the [employer] -From [employer]   No Known Allergies   Outpatient Medications Prior to Visit  Medication Sig Dispense Refill   allopurinol (ZYLOPRIM) 300 MG tablet Take 300 mg by mouth daily.     Omega-3 Fatty Acids (FISH OIL) 1000 MG CAPS Take 1,000 mg by mouth daily.       tamsulosin (FLOMAX) 0.4 MG CAPS capsule Take 0.4 mg by mouth daily after breakfast.     valsartan-hydrochlorothiazide (DIOVAN-HCT) 160-25 MG per tablet Take 1 tablet by mouth daily.     vitamin B-12 (CYANOCOBALAMIN) 1000 MCG tablet Take 1,000 mcg by mouth daily.     Vitamin D, Ergocalciferol, (DRISDOL) 50000 UNITS CAPS capsule Take 50,000 Units by mouth every 21 ( twenty-one) days. Last dose approx 12/27/14     No facility-administered medications prior to visit.        Objective:   Physical Exam  Vitals:   08/21/23 1306  BP: 128/62  Pulse: 73  Temp: 98.1 F (36.7 C)  TempSrc: Oral  SpO2: 97%  Weight: 185 lb 6.4 oz (84.1 kg)  Height: 5\' 7"  (1.702 m)    Gen: Pleasant, well-nourished, in no distress,  normal affect  ENT: No lesions,  mouth clear,  oropharynx clear, no postnasal drip  Neck: No JVD, no stridor  Lungs: No use of accessory muscles, no crackles or wheezing on normal respiration, no wheeze on forced expiration  Cardiovascular: RRR, heart sounds normal, no murmur or gallops, no peripheral edema  Musculoskeletal: No deformities, no cyanosis or clubbing  Neuro: alert, awake, non focal  Skin: Warm, no lesions or rash     Assessment & Plan:  Pulmonary nodules Dedicated CT chest confirms bilateral small (less than 5 mm) subpleural pulmonary nodules consistent with lymph nodes.  We talked about the pros and cons of follow-up.  He is low to moderate risk.  We have decided to repeat a CT chest in 1 year, follow to review  OSA (obstructive sleep apnea) Plan to continue same.  Good compliance.  He follows with Dr. Craige Cotta.    Levy Pupa, MD, PhD 08/21/2023, 1:33 PM [facility] [phone] or if no answer before 7:00PM call [phone] For any issues after 7:00PM please call eLink [phone]   Addendum: The patient is planning for right shoulder surgery to be done under scalene block.  His age and obstructive sleep apnea put him at low  to moderate risk for conscious sedation or anesthesia.  If he is going to require concomitant sedating medication then it may be reasonable for him to bring his home CPAP to have available during the wake up.  There is no pulmonary contraindication to proceeding with this surgery  Levy Pupa, MD, PhD 08/26/2023, 5:01 PM  Pulmonary and Critical Care [phone] or if no answer before 7:00PM call [phone] For any issues after 7:00PM please call eLink [phone]

## 2023-08-21 NOTE — Assessment & Plan Note (Signed)
Plan to continue same.  Good compliance.  He follows with Dr. Craige Cotta.

## 2023-08-21 NOTE — Assessment & Plan Note (Signed)
Dedicated CT chest confirms bilateral small (less than 5 mm) subpleural pulmonary nodules consistent with lymph nodes.  We talked about the pros and cons of follow-up.  He is low to moderate risk.  We have decided to repeat a CT chest in 1 year, follow to review

## 2023-08-21 NOTE — Patient Instructions (Signed)
We reviewed your CT scan of the chest today. We will plan to repeat your CT chest and in October 2025 Continue CPAP as you have been using it Follow Dr. Craige Cotta as planned Follow with Dr. Delton Coombes in October 2025 after your CT scan so we can review those results together

## 2023-08-26 ENCOUNTER — Telehealth: Payer: Self-pay | Admitting: Pulmonary Disease

## 2023-08-26 NOTE — Telephone Encounter (Signed)
I addended his note from 08/21/2023 to characterize his surgical risk.

## 2023-08-26 NOTE — Telephone Encounter (Signed)
Fax received from Dr. Everardo Pacific with [facility] to perform a right total shoulder replacement on patient with interscalene block  Patient needs surgery clearance. Surgery is pending. Patient was seen on 08/21/23. Office protocol is a risk assessment can be sent to surgeon if patient has been seen in 60 days or less.   Sending to Dr Delton Coombes for risk assessment or recommendations if patient needs to be seen in office prior to surgical procedure.

## 2024-08-04 ENCOUNTER — Inpatient Hospital Stay
Admission: RE | Admit: 2024-08-04 | Discharge: 2024-08-04 | Disposition: A | Source: Ambulatory Visit | Attending: Emergency Medicine | Admitting: Emergency Medicine

## 2024-08-04 DIAGNOSIS — R918 Other nonspecific abnormal finding of lung field: Secondary | ICD-10-CM

## 2024-09-20 ENCOUNTER — Encounter (HOSPITAL_BASED_OUTPATIENT_CLINIC_OR_DEPARTMENT_OTHER): Payer: Self-pay

## 2024-10-22 ENCOUNTER — Ambulatory Visit: Payer: Self-pay | Admitting: Emergency Medicine

## 2024-10-25 NOTE — Telephone Encounter (Signed)
 Spoke with patient Bradley Rose, - NFN

## 2024-12-16 ENCOUNTER — Ambulatory Visit: Admitting: Emergency Medicine
